# Patient Record
Sex: Female | Born: 1955 | Hispanic: Yes | Smoking: Never smoker
Health system: Southern US, Community
[De-identification: ages and names within clinical notes are randomized; demographics above are authoritative.]

## PROBLEM LIST (undated history)

## (undated) DIAGNOSIS — I10 Essential (primary) hypertension: Secondary | ICD-10-CM

## (undated) DIAGNOSIS — T7840XA Allergy, unspecified, initial encounter: Secondary | ICD-10-CM

## (undated) DIAGNOSIS — E785 Hyperlipidemia, unspecified: Secondary | ICD-10-CM

## (undated) DIAGNOSIS — Z789 Other specified health status: Secondary | ICD-10-CM

## (undated) HISTORY — DX: Essential (primary) hypertension: I10

## (undated) HISTORY — DX: Allergy, unspecified, initial encounter: T78.40XA

## (undated) HISTORY — DX: Hyperlipidemia, unspecified: E78.5

## (undated) HISTORY — DX: Other specified health status: Z78.9

---

## 1976-10-22 HISTORY — PX: TONSILLECTOMY: SUR1361

## 2012-04-21 DIAGNOSIS — Z0271 Encounter for disability determination: Secondary | ICD-10-CM

## 2012-08-16 ENCOUNTER — Ambulatory Visit (INDEPENDENT_AMBULATORY_CARE_PROVIDER_SITE_OTHER): Payer: BC Managed Care – PPO | Admitting: Emergency Medicine

## 2012-08-16 ENCOUNTER — Ambulatory Visit: Payer: BC Managed Care – PPO

## 2012-08-16 VITALS — BP 154/75 | HR 72 | Temp 98.2°F | Resp 16 | Ht 63.0 in | Wt 138.0 lb

## 2012-08-16 DIAGNOSIS — R0789 Other chest pain: Secondary | ICD-10-CM

## 2012-08-16 DIAGNOSIS — M542 Cervicalgia: Secondary | ICD-10-CM

## 2012-08-16 DIAGNOSIS — R071 Chest pain on breathing: Secondary | ICD-10-CM

## 2012-08-16 DIAGNOSIS — Z23 Encounter for immunization: Secondary | ICD-10-CM

## 2012-08-16 DIAGNOSIS — M549 Dorsalgia, unspecified: Secondary | ICD-10-CM

## 2012-08-16 MED ORDER — NAPROXEN SODIUM 550 MG PO TABS: 550.0000 mg | ORAL_TABLET | Freq: Two times a day (BID) | ORAL | Status: AC

## 2012-08-16 MED ORDER — HYDROCODONE-ACETAMINOPHEN 5-325 MG PO TABS: 1.0000 | ORAL_TABLET | ORAL | Status: AC | PRN

## 2012-08-16 MED ORDER — CYCLOBENZAPRINE HCL 10 MG PO TABS: 10.0000 mg | ORAL_TABLET | Freq: Three times a day (TID) | ORAL | Status: DC | PRN
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
## 2012-08-19 ENCOUNTER — Ambulatory Visit (INDEPENDENT_AMBULATORY_CARE_PROVIDER_SITE_OTHER): Payer: BC Managed Care – PPO | Admitting: Internal Medicine

## 2012-08-19 VITALS — BP 143/74 | HR 76 | Temp 98.0°F | Resp 18 | Ht 62.5 in | Wt 138.8 lb

## 2012-08-19 DIAGNOSIS — M79601 Pain in right arm: Secondary | ICD-10-CM

## 2012-08-19 DIAGNOSIS — S139XXA Sprain of joints and ligaments of unspecified parts of neck, initial encounter: Secondary | ICD-10-CM

## 2012-08-19 DIAGNOSIS — S336XXA Sprain of sacroiliac joint, initial encounter: Secondary | ICD-10-CM

## 2012-08-19 DIAGNOSIS — R209 Unspecified disturbances of skin sensation: Secondary | ICD-10-CM

## 2012-08-19 DIAGNOSIS — S335XXA Sprain of ligaments of lumbar spine, initial encounter: Secondary | ICD-10-CM

## 2012-08-19 MED ORDER — PREDNISONE 10 MG PO TABS: ORAL_TABLET | ORAL | Status: DC
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
## 2012-08-19 NOTE — Progress Notes (Signed)
  Subjective:    Patient ID: Amy [name], female    DOB: 1956-09-26, 56 y.o.   MRN: [id number]  HPI    Review of Systems     Objective:   Physical Exam        Assessment & Plan:

## 2012-08-19 NOTE — Patient Instructions (Addendum)
Take the Prednisone as instructed. Do not take Anaprox or ibuprofen with the prednisone. You may take the Flexeril and Norco(1/2 a pill if desired) at bedtime- or when not going to be driving.

## 2012-08-25 NOTE — Progress Notes (Signed)
Reviewed and agree.

## 2015-02-09 DIAGNOSIS — Z833 Family history of diabetes mellitus: Secondary | ICD-10-CM | POA: Insufficient documentation

## 2016-03-05 ENCOUNTER — Encounter: Payer: Self-pay | Admitting: Gastroenterology

## 2016-04-27 ENCOUNTER — Ambulatory Visit (AMBULATORY_SURGERY_CENTER): Payer: Self-pay | Admitting: *Deleted

## 2016-04-27 VITALS — Ht 63.0 in | Wt 142.0 lb

## 2016-04-27 DIAGNOSIS — Z1211 Encounter for screening for malignant neoplasm of colon: Secondary | ICD-10-CM

## 2016-04-27 MED ORDER — NA SULFATE-K SULFATE-MG SULF 17.5-3.13-1.6 GM/177ML PO SOLN: ORAL | Status: DC

## 2016-04-27 NOTE — Progress Notes (Signed)
No allergies to eggs or soy. No problems with anesthesia.  Pt given Emmi instructions for colonoscopy  No oxygen use  No diet drug use  

## 2016-04-30 ENCOUNTER — Encounter: Payer: Self-pay | Admitting: Gastroenterology

## 2016-05-11 ENCOUNTER — Ambulatory Visit (AMBULATORY_SURGERY_CENTER): Payer: BC Managed Care – PPO | Admitting: Gastroenterology

## 2016-05-11 ENCOUNTER — Encounter: Payer: Self-pay | Admitting: Gastroenterology

## 2016-05-11 VITALS — BP 100/60 | HR 52 | Temp 98.4°F | Resp 10 | Ht 63.0 in | Wt 142.0 lb

## 2016-05-11 DIAGNOSIS — Z1211 Encounter for screening for malignant neoplasm of colon: Secondary | ICD-10-CM | POA: Diagnosis present

## 2016-05-11 MED ORDER — SODIUM CHLORIDE 0.9 % IV SOLN: 500.0000 mL | INTRAVENOUS | Status: DC

## 2016-05-11 NOTE — Progress Notes (Signed)
Procedure:   Colonoscopy  Meds:   [facility]  Indication:  Average risk colon cancer screening  Quality of preparation:  Excellent  Findings:   Left-sided diverticulosis Internal hemorrhoids 11 minute withdrawal Impression:  See above  Recommendations:  Repeat colonoscopy in 10 years for screening   Amy [name] [facility] Pager [phone]

## 2016-05-11 NOTE — Progress Notes (Signed)
Called to room to assist during endoscopic procedure.  Patient ID and intended procedure confirmed with present staff. Received instructions for my participation in the procedure from the performing physician.  

## 2016-05-11 NOTE — Patient Instructions (Signed)
YOU HAD AN ENDOSCOPIC PROCEDURE TODAY AT THE [facility]:   Refer to the procedure report that was given to you for any specific questions about what was found during the examination.  If the procedure report does not answer your questions, please call your gastroenterologist to clarify.  If you requested that your care partner not be given the details of your procedure findings, then the procedure report has been included in a sealed envelope for you to review at your convenience later.  YOU SHOULD EXPECT: Some feelings of bloating in the abdomen. Passage of more gas than usual.  Walking can help get rid of the air that was put into your GI tract during the procedure and reduce the bloating. If you had a lower endoscopy (such as a colonoscopy or flexible sigmoidoscopy) you may notice spotting of blood in your stool or on the toilet paper. If you underwent a bowel prep for your procedure, you may not have a normal bowel movement for a few days.  Please Note:  You might notice some irritation and congestion in your nose or some drainage.  This is from the oxygen used during your procedure.  There is no need for concern and it should clear up in a day or so.  SYMPTOMS TO REPORT IMMEDIATELY:   Following lower endoscopy (colonoscopy or flexible sigmoidoscopy):  Excessive amounts of blood in the stool  Significant tenderness or worsening of abdominal pains  Swelling of the abdomen that is new, acute  Fever of 100F or higher   For urgent or emergent issues, a gastroenterologist can be reached at any hour by calling [phone].   DIET: Your first meal following the procedure should be a small meal and then it is ok to progress to your normal diet. Heavy or fried foods are harder to digest and may make you feel nauseous or bloated.  Likewise, meals heavy in dairy and vegetables can increase bloating.  Drink plenty of fluids but you should avoid alcoholic beverages for 24  hours.  ACTIVITY:  You should plan to take it easy for the rest of today and you should NOT DRIVE or use heavy machinery until tomorrow (because of the sedation medicines used during the test).    FOLLOW UP: Our staff will call the number listed on your records the next business day following your procedure to check on you and address any questions or concerns that you may have regarding the information given to you following your procedure. If we do not reach you, we will leave a message.  However, if you are feeling well and you are not experiencing any problems, there is no need to return our call.  We will assume that you have returned to your regular daily activities without incident.  If any biopsies were taken you will be contacted by phone or by letter within the next 1-3 weeks.  Please call us at [phone] if you have not heard about the biopsies in 3 weeks.    SIGNATURES/CONFIDENTIALITY: You and/or your care partner have signed paperwork which will be entered into your electronic medical record.  These signatures attest to the fact that that the information above on your After Visit Summary has been reviewed and is understood.  Full responsibility of the confidentiality of this discharge information lies with you and/or your care-partner. 

## 2016-05-11 NOTE — Progress Notes (Signed)
Report to PACU, RN, vss, BBS= Clear.  

## 2016-05-11 NOTE — Op Note (Signed)
Amy [name] Patient Name: Amy [name] Procedure Date: 05/11/2016 2:21 PM MRN: [id number] Endoscopist: Sherilyn Cooter L. Myrtie Neither , MD Age: 60 Referring MD:  Date of Birth: December 05, 1955 Gender: Female Account #: [id number] Procedure:                Colonoscopy Indications:              Screening for colorectal malignant neoplasm, This                            is the patient's first colonoscopy Medicines:                Monitored Anesthesia Care Procedure:                Pre-Anesthesia Assessment:                           - Prior to the procedure, a History and Physical                            was performed, and patient medications and                            allergies were reviewed. The patient's tolerance of                            previous anesthesia was also reviewed. The risks                            and benefits of the procedure and the sedation                            options and risks were discussed with the patient.                            All questions were answered, and informed consent                            was obtained. Prior Anticoagulants: The patient has                            taken no previous anticoagulant or antiplatelet                            agents. ASA Grade Assessment: II - A patient with                            mild systemic disease. After reviewing the risks                            and benefits, the patient was deemed in                            satisfactory condition to undergo the procedure.  After obtaining informed consent, the colonoscope                            was passed under direct vision. Throughout the                            procedure, the patient's blood pressure, pulse, and                            oxygen saturations were monitored continuously. The                            Model PCF-H190DL [id number]) scope was introduced                            through the anus and  advanced to the the cecum,                            identified by appendiceal orifice and ileocecal                            valve. The colonoscopy was performed without                            difficulty. The patient tolerated the procedure                            well. The quality of the bowel preparation was                            excellent. No anatomical landmarks were                            photographed due to technical problems. The                            withdrawal time from the cecum was 11 minutes. Scope In: Scope Out: Findings:                 The perianal and digital rectal examinations were                            normal.                           Diverticula were found in the left [name].                           Internal hemorrhoids were found during                            retroflexion. The hemorrhoids were Grade I                            (internal hemorrhoids that do not prolapse).  The exam was otherwise without abnormality. Complications:            No immediate complications. Estimated Blood Loss:     Estimated blood loss: none. Impression:               - Diverticulosis in the left [name].                           - Internal hemorrhoids.                           - The examination was otherwise normal.                           - No specimens collected. Recommendation:           - Patient has a contact number available for                            emergencies. The signs and symptoms of potential                            delayed complications were discussed with the                            patient. Return to normal activities tomorrow.                            Written discharge instructions were provided to the                            patient.                           - Resume previous diet.                           - Continue present medications.                           - Repeat colonoscopy in  10 years for screening                            purposes. Amy L. Myrtie Neitheranis, MD 05/11/2016 5:46:17 PM This report has been signed electronically.

## 2016-05-14 ENCOUNTER — Telehealth: Payer: Self-pay | Admitting: *Deleted

## 2016-05-14 NOTE — Telephone Encounter (Signed)
  Follow up Call-  Call back number 05/11/2016  Post procedure Call Back phone  # [phone]  Permission to leave phone message Yes  Some recent data might be hidden     Patient questions:  Do you have a fever, pain , or abdominal swelling? No. Pain Score  0 *  Have you tolerated food without any problems? Yes.    Have you been able to return to your normal activities? Yes.    Do you have any questions about your discharge instructions: Diet   No. Medications  No. Follow up visit  No.  Do you have questions or concerns about your Care? No.  Actions: * If pain score is 4 or above: No action needed, pain <4.

## 2018-02-12 ENCOUNTER — Encounter (HOSPITAL_COMMUNITY): Payer: Self-pay | Admitting: Emergency Medicine

## 2018-02-12 ENCOUNTER — Emergency Department (HOSPITAL_COMMUNITY): Payer: BC Managed Care – PPO

## 2018-02-12 ENCOUNTER — Emergency Department (HOSPITAL_COMMUNITY)
Admission: EM | Admit: 2018-02-12 | Discharge: 2018-02-13 | Disposition: A | Discharge status: Home / Self Care | Payer: BC Managed Care – PPO | Attending: Emergency Medicine | Admitting: Emergency Medicine

## 2018-02-12 ENCOUNTER — Other Ambulatory Visit: Payer: Self-pay

## 2018-02-12 DIAGNOSIS — K5700 Diverticulitis of small intestine with perforation and abscess without bleeding: Secondary | ICD-10-CM | POA: Insufficient documentation

## 2018-02-12 DIAGNOSIS — Z79899 Other long term (current) drug therapy: Secondary | ICD-10-CM | POA: Diagnosis not present

## 2018-02-12 DIAGNOSIS — R1030 Lower abdominal pain, unspecified: Secondary | ICD-10-CM | POA: Diagnosis present

## 2018-02-12 DIAGNOSIS — K5792 Diverticulitis of intestine, part unspecified, without perforation or abscess without bleeding: Secondary | ICD-10-CM

## 2018-02-12 LAB — COMPREHENSIVE METABOLIC PANEL
ALBUMIN: 4.1 g/dL (ref 3.5–5.0)
ALT: 26 U/L (ref 14–54)
AST: 29 U/L (ref 15–41)
Alkaline Phosphatase: 70 U/L (ref 38–126)
Anion gap: 12 (ref 5–15)
BUN: 13 mg/dL (ref 6–20)
CHLORIDE: 99 mmol/L — AB (ref 101–111)
CO2: 24 mmol/L (ref 22–32)
CREATININE: 0.72 mg/dL (ref 0.44–1.00)
Calcium: 9.8 mg/dL (ref 8.9–10.3)
GFR calc Af Amer: >60 mL/min (ref >60)
GLUCOSE: 108 mg/dL — AB (ref 65–99)
POTASSIUM: 3.7 mmol/L (ref 3.5–5.1)
Sodium: 135 mmol/L (ref 135–145)
Total Bilirubin: 0.7 mg/dL (ref 0.3–1.2)
Total Protein: 7.5 g/dL (ref 6.5–8.1)

## 2018-02-12 LAB — CBC
HCT: 39.9 % (ref 36.0–46.0)
Hemoglobin: 13.1 g/dL (ref 12.0–15.0)
MCH: 29.3 pg (ref 26.0–34.0)
MCHC: 32.8 g/dL (ref 30.0–36.0)
MCV: 89.3 fL (ref 78.0–100.0)
PLATELETS: 318 10*3/uL (ref 150–400)
RBC: 4.47 MIL/uL (ref 3.87–5.11)
RDW: 12.6 % (ref 11.5–15.5)
WBC: 7 10*3/uL (ref 4.0–10.5)

## 2018-02-12 LAB — URINALYSIS, ROUTINE W REFLEX MICROSCOPIC
BILIRUBIN URINE: NEGATIVE
GLUCOSE, UA: NEGATIVE mg/dL
Hgb urine dipstick: NEGATIVE
KETONES UR: NEGATIVE mg/dL
Leukocytes, UA: NEGATIVE
Nitrite: NEGATIVE
PROTEIN: NEGATIVE mg/dL
Specific Gravity, Urine: 1.016 (ref 1.005–1.030)
pH: 6 (ref 5.0–8.0)

## 2018-02-12 LAB — LIPASE, BLOOD: LIPASE: 32 U/L (ref 11–51)

## 2018-02-12 MED ORDER — IOPAMIDOL (ISOVUE-300) INJECTION 61%
INTRAVENOUS | Status: AC
  Filled 2018-02-12: qty 100

## 2018-02-12 MED ORDER — IOPAMIDOL (ISOVUE-300) INJECTION 61%
100.0000 mL | Freq: Once | INTRAVENOUS | Status: AC | PRN
  Administered 2018-02-12: 100 mL via INTRAVENOUS

## 2018-02-12 MED ORDER — FENTANYL CITRATE (PF) 100 MCG/2ML IJ SOLN
50.0000 ug | Freq: Once | INTRAMUSCULAR | Status: AC
  Administered 2018-02-12: 50 ug via INTRAVENOUS
  Filled 2018-02-12: qty 2

## 2018-02-12 MED ORDER — ONDANSETRON HCL 4 MG/2ML IJ SOLN
4.0000 mg | Freq: Once | INTRAMUSCULAR | Status: AC
Start: 2018-02-12 — End: 2018-02-12
  Administered 2018-02-12: 4 mg via INTRAVENOUS
  Filled 2018-02-12: qty 2

## 2018-02-12 NOTE — ED Triage Notes (Signed)
Patient presents to ED for assessment of 1 week of nausea and vomiting with diarrhea, x1 each.  Patient c/o diffuse abdominal pain.  EDP at bedside

## 2018-02-12 NOTE — ED Provider Notes (Signed)
Patient placed in Quick Look pathway, seen and evaluated   Chief Complaint: Abdominal pain  HPI:   Patient is a 62 year old female who presents with a one-week history of lower abdominal pain, nausea, vomiting, and diarrhea.  She has had associated intermittent fever.  She has been taking Tylenol at home without significant relief.  Her pain is sharp.  She reports seeing her primary care provider who gave her medicine for diarrhea, however this is not improving her abdominal pain.  It has improved her diarrhea mildly.    ROS: She reports abdominal pain, nausea, vomiting, and diarrhea.  She denies bloody stools.  She denies any chest pain or shortness of breath.  She has had some nasal congestion, but no cough. (one)  Physical Exam:   Gen: No distress  Neuro: Awake and Alert  Skin: Warm    Focused Exam: Heart normal rhythm, mild tachycardia, lungs clear to auscultation, left lower quadrant and suprapubic tenderness, mild right lower quadrant tenderness, no rigidity, however guarding present; patient actively dry heaving.  CBC, CMP, lipase, UA, CT abdomen pelvis ordered from triage as well as Zofran and pain medication. NPO.   Initiation of care has begun. The patient has been counseled on the process, plan, and necessity for staying for the completion/evaluation, and the remainder of the medical screening examination    Verdis PrimeLaw, Souleymane Saiki M, PA-C 02/12/18 1623    Shaune PollackIsaacs, Cameron, MD 02/13/18 [id number]

## 2018-02-13 MED ORDER — CIPROFLOXACIN IN D5W 400 MG/200ML IV SOLN
400.0000 mg | Freq: Once | INTRAVENOUS | Status: AC
  Administered 2018-02-13: 400 mg via INTRAVENOUS
  Filled 2018-02-13: qty 200

## 2018-02-13 MED ORDER — CIPROFLOXACIN HCL 500 MG PO TABS: 500.0000 mg | ORAL_TABLET | Freq: Two times a day (BID) | ORAL | 0 refills | Status: DC

## 2018-02-13 MED ORDER — ONDANSETRON HCL 4 MG/2ML IJ SOLN
4.0000 mg | Freq: Once | INTRAMUSCULAR | Status: AC
  Administered 2018-02-13: 4 mg via INTRAVENOUS
  Filled 2018-02-13: qty 2

## 2018-02-13 MED ORDER — ONDANSETRON 8 MG PO TBDP: ORAL_TABLET | ORAL | 0 refills | Status: DC

## 2018-02-13 MED ORDER — METRONIDAZOLE IN NACL 5-0.79 MG/ML-% IV SOLN
500.0000 mg | Freq: Once | INTRAVENOUS | Status: AC
  Administered 2018-02-13: 500 mg via INTRAVENOUS
  Filled 2018-02-13: qty 100

## 2018-02-13 MED ORDER — MORPHINE SULFATE (PF) 4 MG/ML IV SOLN
4.0000 mg | Freq: Once | INTRAVENOUS | Status: AC
  Administered 2018-02-13: 4 mg via INTRAVENOUS
  Filled 2018-02-13: qty 1

## 2018-02-13 MED ORDER — METRONIDAZOLE 500 MG PO TABS: 500.0000 mg | ORAL_TABLET | Freq: Two times a day (BID) | ORAL | 0 refills | Status: DC

## 2018-02-13 NOTE — ED Provider Notes (Signed)
[facility] EMERGENCY DEPARTMENT Provider Note   CSN: [id number] Arrival date & time: 02/12/18  1454     History   Chief Complaint Chief Complaint  Patient presents with  . Abdominal Pain    HPI Amy [name] is a 62 y.o. female with a hx of tonsillectomy presents to the Emergency Department complaining of gradual, persistent, progressively worsening lower abdominal pain onset approximately 1 week ago but worse in the last 2 days.  Patient reports she saw her primary care provider 3 days ago and was discharged home with azithromycin.  She reports she has taken this with improvement in her diarrhea but without improvement in her pain or vomiting.  She reports last bout of emesis was this morning.  She has since eaten chicken noodle soup.  Patient reports associated nausea, vomiting and diarrhea.  She reports emesis is nonbloody nonbilious.  Diarrhea is loose stools without melena or hematochezia.  Nothing seems to make her symptoms worse.  Patient denies headache, neck pain, neck stiffness, chest pain, shortness of breath, weakness, dizziness, syncope, dysuria, hematuria, vaginal discharge, vaginal bleeding.  The history is provided by the patient and medical records. No language interpreter was used.    Past Medical History:  Diagnosis Date  . Allergy     There are no active problems to display for this patient.   Past Surgical History:  Procedure Laterality Date  . TONSILLECTOMY  1978     OB History   None      Home Medications    Prior to Admission medications   Medication Sig Start Date End Date Taking? Authorizing Provider  cetirizine (ZYRTEC) 10 MG tablet Take 10 mg by mouth daily.   Yes [provider]  fluticasone (FLONASE) 50 MCG/ACT nasal spray Place 2 sprays into both nostrils daily as needed for allergies. Reported on 04/27/2016   Yes [provider]  Multiple Vitamin (MULTIVITAMIN) tablet Take 1 tablet by mouth daily.   Yes  [provider]  ranitidine (ZANTAC) 150 MG tablet Take 150 mg by mouth 2 (two) times daily as needed for heartburn.    Yes [provider]  ciprofloxacin (CIPRO) 500 MG tablet Take 1 tablet (500 mg total) by mouth 2 (two) times daily. One po bid x 7 days 02/13/18   Tyreece Gelles, Dahlia Client, PA-C  metroNIDAZOLE (FLAGYL) 500 MG tablet Take 1 tablet (500 mg total) by mouth 2 (two) times daily. One po bid x 7 days 02/13/18   Madine Sarr, Dahlia Client, PA-C  ondansetron [facility] ODT) 8 MG disintegrating tablet 8mg  ODT q4 hours prn nausea 02/13/18   Laconda Basich, Dahlia Client, PA-C    Family History Family History  Problem Relation Age of Onset  . Diabetes Mother   . Diabetes Sister   . Diabetes Brother   . [name] cancer Neg Hx     Social History Social History   Tobacco Use  . Smoking status: Never Smoker  . Smokeless tobacco: Never Used  Substance Use Topics  . Alcohol use: No    Alcohol/week: 0.0 oz  . Drug use: No     Allergies   Patient has no known allergies.   Review of Systems Review of Systems  Constitutional: Negative for appetite change, diaphoresis, fatigue, fever and unexpected weight change.  HENT: Negative for mouth sores.   Eyes: Negative for visual disturbance.  Respiratory: Negative for cough, chest tightness, shortness of breath and wheezing.   Cardiovascular: Negative for chest pain.  Gastrointestinal: Positive for abdominal pain, diarrhea, nausea  and vomiting. Negative for constipation.  Endocrine: Negative for polydipsia, polyphagia and polyuria.  Genitourinary: Negative for dysuria, frequency, hematuria and urgency.  Musculoskeletal: Negative for back pain and neck stiffness.  Skin: Negative for rash.  Allergic/Immunologic: Negative for immunocompromised state.  Neurological: Negative for syncope, light-headedness and headaches.  Hematological: Does not bruise/bleed easily.  Psychiatric/Behavioral: Negative for sleep disturbance. The patient is not  nervous/anxious.      Physical Exam Updated Vital Signs BP 100/65 (BP Location: Left Arm)   Pulse 71   Temp 98.4 F (36.9 C) (Oral)   Resp 16   SpO2 100%   Physical Exam  Constitutional: She appears well-developed and well-nourished. No distress.  Awake, alert, nontoxic appearance  HENT:  Head: Normocephalic and atraumatic.  Mouth/Throat: Oropharynx is clear and moist. No oropharyngeal exudate.  Eyes: Conjunctivae are normal. No scleral icterus.  Neck: Normal range of motion. Neck supple.  Cardiovascular: Normal rate, regular rhythm and intact distal pulses.  Pulmonary/Chest: Effort normal and breath sounds normal. No respiratory distress. She has no wheezes.  Equal chest expansion  Abdominal: Soft. Bowel sounds are normal. She exhibits no mass. There is tenderness in the left lower quadrant. There is no rigidity, no rebound, no guarding and no CVA tenderness.  Musculoskeletal: Normal range of motion. She exhibits no edema.  Neurological: She is alert.  Speech is clear and goal oriented Moves extremities without ataxia  Skin: Skin is warm and dry. She is not diaphoretic.  Psychiatric: She has a normal mood and affect.  Nursing note and vitals reviewed.    ED Treatments / Results  Labs (all labs ordered are listed, but only abnormal results are displayed) Labs Reviewed  COMPREHENSIVE METABOLIC PANEL - Abnormal; Notable for the following components:      Result Value   Chloride 99 (*)    Glucose, Bld 108 (*)    All other components within normal limits  LIPASE, BLOOD  CBC  URINALYSIS, ROUTINE W REFLEX MICROSCOPIC     Radiology Ct Abdomen Pelvis W Contrast  Result Date: 02/12/2018 CLINICAL DATA:  Initial evaluation for acute abdominal pain with nausea, vomiting, diarrhea for 1 week. EXAM: CT ABDOMEN AND PELVIS WITH CONTRAST TECHNIQUE: Multidetector CT imaging of the abdomen and pelvis was performed using the standard protocol following bolus administration of  intravenous contrast. CONTRAST:  ISOVUE-300 IOPAMIDOL (ISOVUE-300) INJECTION 61% COMPARISON:  None. FINDINGS: Lower chest: Mild scattered bibasilar atelectatic changes. Visualized lung bases are otherwise clear. Hepatobiliary: 9 mm hypodensity within the left hepatic lobe noted, too small the characterize, but could reflect a small cyst or possibly hemangioma. This is of doubtful significance. Liver otherwise unremarkable. Gallbladder within normal limits. No biliary dilatation. Pancreas: Pancreas within normal limits. Spleen: 1 cm cyst present within the medial aspect of the spleen. Spleen otherwise unremarkable. Adrenals/Urinary Tract: Adrenal glands are normal. Kidneys equal in size with symmetric enhancement. No nephrolithiasis, hydronephrosis, or focal enhancing renal mass. No appreciable hydroureter. Bladder largely decompressed without acute abnormality. Stomach/Bowel: Stomach within normal limits. No evidence for bowel obstruction. Appendix within normal limits. Moderate to large volume stool within the ascending and transverse [name] colonic diverticulosis. Circumferential wall thickening with mild hazy stranding seen about a short segment of sigmoid [name] within the left lower quadrant, suggesting possible acute sigmoid diverticulitis (series 3, image 70). No evidence for perforation or other complication. No other acute inflammatory changes seen about the bowels. Vascular/Lymphatic: Normal intravascular enhancement seen throughout the intra-abdominal aorta. No aneurysm. Mesenteric vessels patent proximally. No adenopathy.  Reproductive: 3.2 cm round lesion within the uterus most likely reflects a uterine fibroid. Left ovary closely approximates the inflammatory changes within the adjacent sigmoid [name]. Right ovary within normal limits. Other: No free air or fluid. Small fat containing paraumbilical hernia noted. Musculoskeletal: No acute osseus abnormality. No worrisome lytic or blastic osseous  lesions. Degenerative spondylolysis noted at L4-5 and L5-S1. IMPRESSION: 1. Focal wall thickening with mild inflammatory stranding about a few sigmoid diverticula in the left lower quadrant, suggestive of acute sigmoid diverticulitis. Clinical follow-up to resolution and correlation with colonoscopy recommended as an underlying mass lesion could have a similar appearance. 2. Moderate to large volume retained stool within the proximal and mid [name], suggesting constipation. 3. Fibroid uterus. Electronically Signed   By: Rise MuBenjamin  McClintock M.D.   On: 02/12/2018 19:28       Procedures Procedures (including critical care time)  Medications Ordered in ED Medications  ondansetron (ZOFRAN) injection 4 mg (4 mg Intravenous Given 02/12/18 1650)  fentaNYL (SUBLIMAZE) injection 50 mcg (50 mcg Intravenous Given 02/12/18 1652)  iopamidol (ISOVUE-300) 61 % injection 100 mL (100 mLs Intravenous Contrast Given 02/12/18 1845)  metroNIDAZOLE (FLAGYL) IVPB 500 mg (0 mg Intravenous Stopped 02/13/18 0224)  ciprofloxacin (CIPRO) IVPB 400 mg (0 mg Intravenous Stopped 02/13/18 0224)  morphine 4 MG/ML injection 4 mg (4 mg Intravenous Given 02/13/18 0114)  ondansetron (ZOFRAN) injection 4 mg (4 mg Intravenous Given 02/13/18 0114)     Initial Impression / Assessment and Plan / ED Course  I have reviewed the triage vital signs and the nursing notes.  Pertinent labs & imaging results that were available during my care of the patient were reviewed by me and considered in my medical decision making (see chart for details).     Patient with lower abdominal and left lower abdominal pain for several days with nausea vomiting and diarrhea.  Labs are reassuring.  No leukocytosis.  No evidence of urinary tract infection on urinalysis.  Normal lipase and AST/ALT.  No evidence of pancreatitis or hepatitis.  Patient does not currently menstruate, doubt ectopic pregnancy.  CT scan shows evidence of diverticulitis.  No evidence of  abscess or bowel perforation.  I personally evaluated these images.  Patient abdomen without rebound or guarding.  No additional emesis here in the emergency department.  Patient given IV antibiotics and she has tolerated p.o. without difficulty.  Patient without hypotension.  She was initially tachycardic upon arrival however this resolved with fluids.  She has been afebrile throughout her time.  Will be discharged home with oral antibiotics.  Patient will need primary care follow-up this week to ensure resolution.  She is to return immediately to the emergency department for worsening symptoms, fevers, persistent vomiting.  Patient and daughter-in-law state understanding and are in agreement with this plan.  Final Clinical Impressions(s) / ED Diagnoses   Final diagnoses:  Diverticulitis    ED Discharge Orders        Ordered    metroNIDAZOLE (FLAGYL) 500 MG tablet  2 times daily     02/13/18 0232    ciprofloxacin (CIPRO) 500 MG tablet  2 times daily     02/13/18 0232    ondansetron (ZOFRAN ODT) 8 MG disintegrating tablet     02/13/18 0232       Orlandis Sanden, Dahlia ClientHannah, PA-C 02/13/18 0251    Derwood KaplanNanavati, Ankit, MD 02/13/18 [phone]

## 2018-02-13 NOTE — ED Notes (Signed)
PT states understanding of care given, follow up care, and medication prescribed. PT ambulated from ED to car with a steady gait. 

## 2018-02-13 NOTE — Discharge Instructions (Addendum)
1. Medications: zofran, Cipro, Flagyl, usual home medications 2. Treatment: rest, drink plenty of fluids, advance diet slowly 3. Follow Up: Please followup with your primary doctor in 2 days for discussion of your diagnoses and further evaluation after today's visit; if you do not have a primary care doctor use the resource guide provided to find one; Please return to the ER for persistent vomiting, high fevers, worsening pain or worsening symptoms

## 2018-03-20 ENCOUNTER — Ambulatory Visit: Payer: BC Managed Care – PPO | Admitting: Gastroenterology

## 2018-03-20 ENCOUNTER — Encounter: Payer: Self-pay | Admitting: Gastroenterology

## 2018-03-20 VITALS — BP 118/64 | HR 88 | Ht 62.0 in | Wt 141.8 lb

## 2018-03-20 DIAGNOSIS — K5732 Diverticulitis of large intestine without perforation or abscess without bleeding: Secondary | ICD-10-CM | POA: Diagnosis not present

## 2018-03-20 DIAGNOSIS — K5909 Other constipation: Secondary | ICD-10-CM

## 2018-03-20 NOTE — Patient Instructions (Addendum)
Continue metamucil daily.  One half capful miralax powder in a glass of liquid every other day.  It was a pleasure to meet you today!  Dr. Myrtie Neither

## 2018-03-20 NOTE — Progress Notes (Signed)
[facility] Progress Note  Chief Complaint: Diverticulitis  Subjective  History:  This is a very pleasant 62 year old woman who last saw me for screening colonoscopy in July 2017, at which time diverticulosis and no colon polyps were found.  She came to the ED a month ago with about a week of generalized abdominal pain that was slowly getting worse with associated intermittent vomiting.  She was having a period of constipation leading up to that illness, which had occurred occasionally in the past as well.  When she has constipation she might get some blood with wiping after bowel movements.  She was treated with 7 days of ciprofloxacin and metronidazole and has mostly recovered.  She is still having some occasional constipation that she is treated with decreasing carbohydrates, increasing vegetables and starting Metamucil and increasing water intake.  She has a bowel movement at least once a day, but does not always feel completely evacuated. The abdominal pain has resolved she has had no recent bleeding.  ROS: Cardiovascular:  no chest pain Respiratory: no dyspnea  The patient's Past Medical, Family and Social History were reviewed and are on file in the EMR.  Objective:  Med list reviewed  Current Outpatient Medications:  .  cetirizine (ZYRTEC) 10 MG tablet, Take 10 mg by mouth daily., Disp: , Rfl:  .  fluticasone (FLONASE) 50 MCG/ACT nasal spray, Place 2 sprays into both nostrils daily as needed for allergies. Reported on 04/27/2016, Disp: , Rfl:  .  Multiple Vitamin (MULTIVITAMIN) tablet, Take 1 tablet by mouth daily., Disp: , Rfl:  .  ranitidine (ZANTAC) 150 MG tablet, Take 150 mg by mouth 2 (two) times daily as needed for heartburn. , Disp: , Rfl:    Vital signs in last 24 hrs: Vitals:   03/20/18 1554  BP: 118/64  Pulse: 88    Physical Exam  Well-appearing  HEENT: sclera anicteric, oral mucosa moist without lesions  Neck: supple, no thyromegaly, JVD or  lymphadenopathy  Cardiac: RRR without murmurs, S1S2 heard, no peripheral edema  Pulm: clear to auscultation bilaterally, normal RR and effort noted  Abdomen: soft, no tenderness, with active bowel sounds. No guarding or palpable hepatosplenomegaly.  Skin; warm and dry, no jaundice or rash  Recent Labs:  CMP Latest Ref Rng & Units 02/12/2018  Glucose 65 - 99 mg/dL 098(J)  BUN 6 - 20 mg/dL 13  Creatinine 1.91 - 4.78 mg/dL 2.95  Sodium 621 - 308 mmol/L 135  Potassium 3.5 - 5.1 mmol/L 3.7  Chloride 101 - 111 mmol/L 99(L)  CO2 22 - 32 mmol/L 24  Calcium 8.9 - 10.3 mg/dL 9.8  Total Protein 6.5 - 8.1 g/dL 7.5  Total Bilirubin 0.3 - 1.2 mg/dL 0.7  Alkaline Phos 38 - 126 U/L 70  AST 15 - 41 U/L 29  ALT 14 - 54 U/L 26   CBC Latest Ref Rng & Units 02/12/2018  WBC 4.0 - 10.5 K/uL 7.0  Hemoglobin 12.0 - 15.0 g/dL 65.7  Hematocrit 84.6 - 46.0 % 39.9  Platelets 150 - 400 K/uL 318     Radiologic studies:  CTAP 02/12/18 (personally reviewed:  Report of sigmoid diverticulitis, significant stool and uterine fibroids  @ Assessment: Encounter Diagnoses  Name Primary?  . Diverticulitis of colon Yes  . Chronic constipation    Acute uncomplicated diverticulitis, first episode.  It was possibly precipitated by a period of constipation.  I think ischemic colitis is less likely.   Plan: Continue current management with high-fiber  diet, daily Metamucil, but I have also asked her to add one half capful of MiraLAX powder every other day to maintain regularity. I do not feel she needs any additional testing at this time, and she will see me as needed.   Total time 25 minutes, over half spent face-to-face with patient in counseling and coordination of care.   Charlie Pitter III

## 2019-07-24 ENCOUNTER — Ambulatory Visit
Admission: RE | Admit: 2019-07-24 | Discharge: 2019-07-24 | Disposition: A | Discharge status: Home / Self Care | Payer: BC Managed Care – PPO | Source: Ambulatory Visit | Attending: Family Medicine | Admitting: Family Medicine

## 2019-07-24 ENCOUNTER — Other Ambulatory Visit: Payer: Self-pay | Admitting: Family Medicine

## 2019-07-24 DIAGNOSIS — R52 Pain, unspecified: Secondary | ICD-10-CM

## 2020-09-02 ENCOUNTER — Ambulatory Visit: Payer: BC Managed Care – PPO | Admitting: Allergy

## 2020-09-02 ENCOUNTER — Other Ambulatory Visit: Payer: Self-pay

## 2020-09-02 ENCOUNTER — Encounter: Payer: Self-pay | Admitting: Allergy

## 2020-09-02 VITALS — BP 116/64 | HR 86 | Temp 98.2°F | Resp 16 | Ht 63.0 in | Wt 146.4 lb

## 2020-09-02 DIAGNOSIS — J3089 Other allergic rhinitis: Secondary | ICD-10-CM

## 2020-09-02 DIAGNOSIS — H1013 Acute atopic conjunctivitis, bilateral: Secondary | ICD-10-CM | POA: Diagnosis not present

## 2020-09-02 MED ORDER — LEVOCETIRIZINE DIHYDROCHLORIDE 5 MG PO TABS: 5.0000 mg | ORAL_TABLET | Freq: Every evening | ORAL | 5 refills | Status: DC

## 2020-09-02 MED ORDER — OLOPATADINE HCL 0.2 % OP SOLN: 1.0000 [drp] | Freq: Every day | OPHTHALMIC | 5 refills | Status: DC | PRN

## 2020-09-02 NOTE — Patient Instructions (Addendum)
Allergic rhinitis with conjunctivitis  - environmental allergy skin testing is positive to tree pollen, molds, dog  - allergen avoidance measures discussed/handouts provided  - stop Zyrtec as not effective  - start either Xyzal 5mg  or Allegra 180mg  daily.  These are long-acting antihistamines similar to Zyrtec that may be more effective for you  - for watery, red, itchy eyes use over-the-counter Pataday or Pataday Xtra Strength 1 drop each eye daily as needed  - if allergy medications are not effective in managing your eye symptoms then will consider performing patch testing for possible contact allergy  Follow-up in 4-6 months or sooner if needed

## 2020-09-02 NOTE — Progress Notes (Addendum)
New Patient Note  RE: Amy [name] MRN: [id number] DOB: 05/25/1956 Date of Office Visit: 09/02/2020  Referring provider: No ref. provider found Primary care provider: Lewis Moccasin, MD  Chief Complaint: eye issues  History of present illness: Amy [name] is a 64 y.o. female presenting today for consultation for ocular allergy.   She states she has eye swelling, redness, watering and hurts and states can occur with both eyes.  Symptoms can be year-round.  Can occur in any environment.    She did try her grandson's allergy eyedrop before and it was helpful in minimizing her symptoms.  This eyedrop was Pataday. She then bought Opcon over-the-counter eye drop and states it "refreshes" the eye but was not as effective as the Pataday was.   She states she stopped wearing make-up as she was not sure if her makeup products were causing her ocular symptoms.  She however denies having any rash of the skin around the eye.  She takes zyrtec daily in AM for years.  She also reports runny nose, sneezing, facial itch.  She notices these symptoms more in winter time but also notes during pollen season.    No history of asthma, eczema or food allergy.   Review of systems: Review of Systems  Constitutional: Negative.   HENT: Negative.   Eyes: Positive for discharge (Watering) and redness.  Respiratory: Negative.   Cardiovascular: Negative.   Gastrointestinal: Negative.   Musculoskeletal: Negative.   Skin: Negative.   Neurological: Negative.     All other systems negative unless noted above in HPI  Past medical history: Past Medical History:  Diagnosis Date  . Allergy     Past surgical history: Past Surgical History:  Procedure Laterality Date  . TONSILLECTOMY  1978    Family history:  Family History  Problem Relation Age of Onset  . Diabetes Mother   . Diabetes Sister   . Diabetes Brother   . Colon cancer Neg Hx     Social history: She lives in a  townhome with electric heating and central cooling.  No pets in the home.  There is no concern for water damage, mildew or worse in the home.  She works in a [employer].  Denies a smoking history.  Medication List: Current Outpatient Medications  Medication Sig Dispense Refill  . cetirizine (ZYRTEC) 10 MG tablet Take 10 mg by mouth daily.    . Multiple Vitamin (MULTIVITAMIN) tablet Take 1 tablet by mouth daily.     No current facility-administered medications for this visit.    Known medication allergies: No Known Allergies   Physical examination: Blood pressure 116/64, pulse 86, temperature 98.2 F (36.8 C), temperature source Temporal, resp. rate 16, height 5\' 3"  (1.6 m), weight 146 lb 6.4 oz (66.4 kg), SpO2 96 %.  General: Alert, interactive, in no acute distress. HEENT: PERRLA, TMs pearly gray, turbinates non-edematous without discharge, post-pharynx non erythematous. Neck: Supple without lymphadenopathy. Lungs: Clear to auscultation without wheezing, rhonchi or rales. {no increased work of breathing. CV: Normal S1, S2 without murmurs. Abdomen: Nondistended, nontender. Skin: Warm and dry, without lesions or rashes. Extremities:  No clubbing, cyanosis or edema. Neuro:   Grossly intact.  Diagnositics/Labs: Allergy testing: Environmental allergy skin prick testing is positive to birch, hickory, Rhizopus, dog.  Intradermal testing is positive to mold mix 2. Allergy testing results were read and interpreted by provider, documented by clinical staff.   Assessment and plan:   Allergic rhinitis with conjunctivitis  - environmental  allergy skin testing is positive to tree pollen, molds, dog  - allergen avoidance measures discussed/handouts provided  - stop Zyrtec as not effective  - start either Xyzal 5mg  or Allegra 180mg  daily.  These are long-acting antihistamines similar to Zyrtec that may be more effective for you  - for watery, red, itchy eyes use over-the-counter  Pataday or Pataday Xtra Strength 1 drop each eye daily as needed  - if allergy medications are not effective in managing your eye symptoms then will consider performing patch testing for possible contact allergy  Follow-up in 4-6 months or sooner if needed  I appreciate the opportunity to take part in Tanyia's care. Please do not hesitate to contact me with questions.  Sincerely,   , MD Allergy/Immunology [facility]

## 2020-10-05 ENCOUNTER — Ambulatory Visit: Payer: Self-pay | Admitting: Allergy

## 2021-01-05 ENCOUNTER — Encounter: Payer: Self-pay | Admitting: Allergy

## 2021-01-05 ENCOUNTER — Ambulatory Visit: Payer: BC Managed Care – PPO | Admitting: Allergy

## 2021-01-05 ENCOUNTER — Other Ambulatory Visit: Payer: Self-pay

## 2021-01-05 VITALS — BP 122/68 | HR 75 | Temp 98.0°F | Resp 17 | Ht 63.0 in | Wt 146.2 lb

## 2021-01-05 DIAGNOSIS — H1013 Acute atopic conjunctivitis, bilateral: Secondary | ICD-10-CM

## 2021-01-05 DIAGNOSIS — J3089 Other allergic rhinitis: Secondary | ICD-10-CM | POA: Diagnosis not present

## 2021-01-05 DIAGNOSIS — B351 Tinea unguium: Secondary | ICD-10-CM | POA: Diagnosis not present

## 2021-01-05 MED ORDER — OLOPATADINE HCL 0.2 % OP SOLN: 1.0000 [drp] | Freq: Every day | OPHTHALMIC | 5 refills | Status: AC | PRN

## 2021-01-05 MED ORDER — FEXOFENADINE HCL 180 MG PO TABS: 180.0000 mg | ORAL_TABLET | Freq: Every day | ORAL | 5 refills | Status: AC

## 2021-01-05 NOTE — Patient Instructions (Addendum)
Allergic rhinitis with conjunctivitis  - continue avoidance measures for tree pollen, molds, dog  - Xyzal causes drowsiness thus will stop.   Will try Allegra 180mg  daily to replace Xyzal.  Let know if you tolerate this antihistamine and if it causes drowsiness or not  - for watery, red, itchy eyes use Pataday or Pataday Xtra Strength 1 drop each eye daily as needed  - if allergy medications are not effective in managing your eye symptoms then will consider performing patch testing for possible contact allergy  Will place podiatry referral for you to evaluate for toenail fungus  Follow-up in 6 months or sooner if needed

## 2021-01-05 NOTE — Progress Notes (Signed)
Follow-up Note  RE: Amy [name] MRN: [id number] DOB: [birth year] Date of Office Visit: 01/05/2021   History of present illness: Amy [name] is a 65 y.o. female presenting today for follow-up of allergic rhinitis with conjunctivitis.  She was last seen in the office on 09/02/2020 by myself.  She states she has been doing okay since her last visit without any major health changes, surgeries or hospitalizations.  She does states that the Xyzal makes her sleepy and she is still drowsy into the next morning.  She does feel like it helps more so than the Zyrtec did.  But due to the drowsiness she would like to try something different.  She states that the Pataday is still very effective in relieving her itchy and watery eyes. She does have asked today about issues with her foot.  She states when she was at her pedicurist she was concerned she may have a toe fungus.  She is not sure what she can do about this or who she should see.     Review of systems: Review of Systems  Constitutional: Negative.   HENT: Negative.   Eyes: Negative.   Respiratory: Negative.   Cardiovascular: Negative.   Gastrointestinal: Negative.   Musculoskeletal: Negative.   Skin: Negative.   Neurological: Negative.     All other systems negative unless noted above in HPI  Past medical/social/surgical/family history have been reviewed and are unchanged unless specifically indicated below.  No changes  Medication List: Current Outpatient Medications  Medication Sig Dispense Refill  . fexofenadine (ALLEGRA) 180 MG tablet Take 1 tablet (180 mg total) by mouth daily. 1 tablet 5  . levocetirizine (XYZAL) 5 MG tablet Take 1 tablet (5 mg total) by mouth every evening. 30 tablet 5  . Multiple Vitamin (MULTIVITAMIN) tablet Take 1 tablet by mouth daily.    . Olopatadine HCl 0.2 % SOLN Apply 1 drop to eye daily as needed (watery, red, itchy eyes). 2.5 mL 5   No current facility-administered medications for  this visit.     Known medication allergies: No Known Allergies   Physical examination: Blood pressure 122/68, pulse 75, temperature 98 F (36.7 C), temperature source Temporal, resp. rate 17, height 5\' 3"  (1.6 m), weight 146 lb 3.2 oz (66.3 kg), SpO2 95 %.  General: Alert, interactive, in no acute distress. HEENT: PERRLA, TMs pearly gray, turbinates non-edematous without discharge, post-pharynx non erythematous. Neck: Supple without lymphadenopathy. Lungs: Clear to auscultation without wheezing, rhonchi or rales. {no increased work of breathing. CV: Normal S1, S2 without murmurs. Abdomen: Nondistended, nontender. Skin: Warm and dry, without lesions or rashes. Extremities: Toenails are painted with nail polish.  No clubbing, cyanosis or edema. Neuro:   Grossly intact.  Diagnositics/Labs: None today  Assessment and plan:   Allergic rhinitis with conjunctivitis  - continue avoidance measures for tree pollen, molds, dog  - Xyzal causes drowsiness thus will stop.   Will try Allegra 180mg  daily to replace Xyzal.  Let know if you tolerate this antihistamine and if it causes drowsiness or not  - for watery, red, itchy eyes use Pataday or Pataday Xtra Strength 1 drop each eye daily as needed  - if allergy medications are not effective in managing your eye symptoms then will consider performing patch testing for possible contact allergy  ?  Toenail fungus -Toenails are nicely painted today thus cannot visualize if there is potentially a toenail fungus  - Will place podiatry referral for you to evaluate for toenail fungus  Follow-up in 6 months or sooner if needed  I appreciate the opportunity to take part in Cire's care. Please do not hesitate to contact me with questions.  Sincerely,   Margo Aye, MD Allergy/Immunology [facility]

## 2021-01-06 NOTE — Progress Notes (Signed)
Referral has been placed internally with [facility] in [place]. Patient has been notified with all information.

## 2021-01-12 NOTE — Progress Notes (Signed)
Following back up to make sure patient was scheduled:  Patient looks like she was scheduled for 01/17/21 to see a provider at the [facility].

## 2021-01-17 ENCOUNTER — Other Ambulatory Visit: Payer: Self-pay | Admitting: Podiatry

## 2021-01-17 ENCOUNTER — Other Ambulatory Visit: Payer: Self-pay

## 2021-01-17 ENCOUNTER — Ambulatory Visit: Payer: BC Managed Care – PPO | Admitting: Podiatry

## 2021-01-17 DIAGNOSIS — B351 Tinea unguium: Secondary | ICD-10-CM

## 2021-01-17 DIAGNOSIS — B353 Tinea pedis: Secondary | ICD-10-CM | POA: Diagnosis not present

## 2021-01-17 MED ORDER — KETOCONAZOLE 2 % EX CREA: 1.0000 "application " | TOPICAL_CREAM | Freq: Every day | CUTANEOUS | 2 refills | Status: DC

## 2021-01-17 MED ORDER — TERBINAFINE HCL 250 MG PO TABS: 250.0000 mg | ORAL_TABLET | Freq: Every day | ORAL | 0 refills | Status: AC

## 2021-01-17 MED ORDER — JUBLIA 10 % EX SOLN: 1.0000 "application " | Freq: Every day | CUTANEOUS | 11 refills | Status: DC

## 2021-01-17 NOTE — Patient Instructions (Addendum)
If you need a coupon for the copay card for Jublia go to  https://www.orthorxaccess.com/ Fungal Nail Infection A fungal nail infection is a common infection of the toenails or fingernails. This condition affects toenails more often than fingernails. It often affects the great, or big, toes. More than one nail may be infected. The condition can be passed from person to person (is contagious). What are the causes? This condition is caused by a fungus. Several types of fungi can cause the infection. These fungi are common in moist and warm areas. If your hands or feet come into contact with the fungus, it may get into a crack in your fingernail or toenail and cause the infection. What increases the risk? The following factors may make you more likely to develop this condition:  Being female.  Being of older age.  Living with someone who has the fungus.  Walking barefoot in areas where the fungus thrives, such as showers or locker rooms.  Wearing shoes and socks that cause your feet to sweat.  Having a nail injury or a recent nail surgery.  Having certain medical conditions, such as: ? Athlete's foot. ? Diabetes. ? Psoriasis. ? Poor circulation. ? A weak body defense system (immune system). What are the signs or symptoms? Symptoms of this condition include:  A pale spot on the nail.  Thickening of the nail.  A nail that becomes yellow or brown.  A brittle or ragged nail edge.  A crumbling nail.  A nail that has lifted away from the nail bed.   How is this diagnosed? This condition is diagnosed with a physical exam. Your health care provider may take a scraping or clipping from your nail to test for the fungus. How is this treated? Treatment is not needed for mild infections. If you have significant nail changes, treatment may include:  Antifungal medicines taken by mouth (orally). You may need to take the medicine for several weeks or several months, and you may not see the  results for a long time. These medicines can cause side effects. Ask your health care provider what problems to watch for.  Antifungal nail polish or nail cream. These may be used along with oral antifungal medicines.  Laser treatment of the nail.  Surgery to remove the nail. This may be needed for the most severe infections. It can take a long time, usually up to a year, for the infection to go away. The infection may also come back.   Follow these instructions at home: Medicines  Take or apply over-the-counter and prescription medicines only as told by your health care provider.  Ask your health care provider about using over-the-counter mentholated ointment on your nails. Nail care  Trim your nails often.  Wash and dry your hands and feet every day.  Keep your feet dry: ? Wear absorbent socks, and change your socks frequently. ? Wear shoes that allow air to circulate, such as sandals or canvas tennis shoes. Throw out old shoes.  Do not use artificial nails.  If you go to a nail salon, make sure you choose one that uses clean instruments.  Use antifungal foot powder on your feet and in your shoes. General instructions  Do not share personal items, such as towels or nail clippers.  Do not walk barefoot in shower rooms or locker rooms.  Wear rubber gloves if you are working with your hands in wet areas.  Keep all follow-up visits as told by your health care provider. This  is important. Contact a health care provider if: Your infection is not getting better or it is getting worse after several months. Summary  A fungal nail infection is a common infection of the toenails or fingernails.  Treatment is not needed for mild infections. If you have significant nail changes, treatment may include taking medicine orally and applying medicine to your nails.  It can take a long time, usually up to a year, for the infection to go away. The infection may also come back.  Take or  apply over-the-counter and prescription medicines only as told by your health care provider.  Follow instructions for taking care of your nails to help prevent infection from coming back or spreading. This information is not intended to replace advice given to you by your health care provider. Make sure you discuss any questions you have with your health care provider. Document Revised: 01/29/2019 Document Reviewed: 03/14/2018 Elsevier Patient Education  2021 Elsevier Inc.  Athlete's Foot Athlete's foot (tinea pedis) is a fungal infection of the skin on your feet. It often occurs on the skin that is between or underneath the toes. It can also occur on the soles of your feet. The infection can spread from person to person (is contagious). It can also spread when a person's bare feet come in contact with the fungus on shower floors or on items such as shoes. What are the causes? This condition is caused by a fungus that grows in warm, moist places. You can get athlete's foot by sharing shoes, shower stalls, towels, and wet floors with someone who is infected. Not washing your feet or changing your socks often enough can also lead to athlete's foot. What increases the risk? This condition is more likely to develop in:  Men.  People who have a weak body defense system (immune system).  People who have diabetes.  People who use public showers, such as at a gym.  People who wear heavy-duty shoes, such as Youth worker.  Seasons with warm, humid weather. What are the signs or symptoms? Symptoms of this condition include:  Itchy areas between your toes or on the soles of your feet.  White, flaky, or scaly areas between your toes or on the soles of your feet.  Very itchy small blisters between your toes or on the soles of your feet.  Small cuts in your skin. These cuts can become infected.  Thick or discolored toenails.   How is this diagnosed? This condition may be  diagnosed with a physical exam and a review of your medical history. Your health care provider may also take a skin or toenail sample to examine under a microscope. How is this treated? This condition is treated with antifungal medicines. These may be applied as powders, ointments, or creams. In severe cases, an oral antifungal medicine may be given. Follow these instructions at home: Medicines  Apply or take over-the-counter and prescription medicines only as told by your health care provider.  Apply your antifungal medicine as told by your health care provider. Do not stop using the antifungal even if your condition improves. Foot care  Do not scratch your feet.  Keep your feet dry: ? Wear cotton or wool socks. Change your socks every day or if they become wet. ? Wear shoes that allow air to flow, such as sandals or canvas tennis shoes.  Wash and dry your feet, including the area between your toes. Also, wash and dry your feet: ? Every day  or as told by your health care provider. ? After exercising. General instructions  Do not let others use towels, shoes, nail clippers, or other personal items that touch your feet.  Protect your feet by wearing sandals in wet areas, such as locker rooms and shared showers.  Keep all follow-up visits as told by your health care provider. This is important.  If you have diabetes, keep your blood sugar under control. Contact a health care provider if:  You have a fever.  You have swelling, soreness, warmth, or redness in your foot.  Your feet are not getting better with treatment.  Your symptoms get worse.  You have new symptoms. Summary  Athlete's foot (tinea pedis) is a fungal infection of the skin on your feet. It often occurs on skin that is between or underneath the toes.  This condition is caused by a fungus that grows in warm, moist places.  Symptoms include white, flaky, or scaly areas between your toes or on the soles of your  feet.  This condition is treated with antifungal medicines.  Keep your feet clean. Always dry them thoroughly. This information is not intended to replace advice given to you by your health care provider. Make sure you discuss any questions you have with your health care provider. Document Revised: 05/26/2020 Document Reviewed: 05/26/2020 Elsevier Patient Education  2021 [employer].

## 2021-01-18 ENCOUNTER — Encounter: Payer: Self-pay | Admitting: Podiatry

## 2021-01-18 NOTE — Progress Notes (Signed)
  Subjective:  Patient ID: Amy [name], female    DOB: December 30, 1955,  MRN: [id number]  Chief Complaint  Patient presents with  . Nail Problem    Left foot discolored nails     65 y.o. female presents with the above complaint. History confirmed with patient.   Objective:  Physical Exam: warm, good capillary refill, no trophic changes or ulcerative lesions, normal DP and PT pulses and normal sensory exam.  Discolored left hallux and fifth toenails with onycholysis and thickening and yellow discoloration with subungual debris    Assessment:   1. Onychomycosis   2. Tinea pedis of left foot      Plan:  Patient was evaluated and treated and all questions answered.  Discussed treatment options in detail for onychomycosis and onycholysis.  Discussed oral and topical treatment options.  A nail culture for fungal analysis was taken.  This will be sent and analyzed.  Recommend both topical and oral treatment dual therapy.  Prescriptions for Jublia and Lamisil sent to her pharmacy.  Return in about 4 months (around 05/19/2021) for follow up on nail fungus .

## 2021-01-18 NOTE — Telephone Encounter (Signed)
Please advise 

## 2021-02-08 ENCOUNTER — Other Ambulatory Visit: Payer: Self-pay | Admitting: Family Medicine

## 2021-02-08 DIAGNOSIS — Z1231 Encounter for screening mammogram for malignant neoplasm of breast: Secondary | ICD-10-CM

## 2021-04-25 ENCOUNTER — Ambulatory Visit: Payer: BC Managed Care – PPO | Admitting: Podiatry

## 2021-05-16 ENCOUNTER — Ambulatory Visit: Payer: BC Managed Care – PPO | Admitting: Podiatry

## 2021-05-16 ENCOUNTER — Encounter: Payer: Self-pay | Admitting: Podiatry

## 2021-05-16 ENCOUNTER — Other Ambulatory Visit: Payer: Self-pay

## 2021-05-16 DIAGNOSIS — B351 Tinea unguium: Secondary | ICD-10-CM | POA: Diagnosis not present

## 2021-05-17 NOTE — Progress Notes (Signed)
  Subjective:  Patient ID: Amy [name], female    DOB: May 18, 1956,  MRN: [id number]  Chief Complaint  Patient presents with   Nail Problem    4 month follow up nail fungus    65 y.o. female returns for follow-up with the above complaint. History confirmed with patient.  She is doing very well the Jublia has been quite helpful  Objective:  Physical Exam: warm, good capillary refill, no trophic changes or ulcerative lesions, normal DP and PT pulses and normal sensory exam.  Discolored left hallux nail now limited to the proximal distal quadrant      Assessment:   1. Onychomycosis       Plan:  Patient was evaluated and treated and all questions answered.  She is doing very well I recommend she continue the Jublia at this point.  Do not see a reason to restart terbinafine at this point.  She will call if she needs refills.  Return to see me as needed if it does not improve within 3 to 4 months  Return if symptoms worsen or fail to improve.

## 2021-08-24 ENCOUNTER — Ambulatory Visit
Admission: RE | Admit: 2021-08-24 | Discharge: 2021-08-24 | Disposition: A | Discharge status: Home / Self Care | Payer: BC Managed Care – PPO | Source: Ambulatory Visit | Attending: Family Medicine | Admitting: Family Medicine

## 2021-08-24 ENCOUNTER — Other Ambulatory Visit: Payer: Self-pay | Admitting: Family Medicine

## 2021-08-24 DIAGNOSIS — G8929 Other chronic pain: Secondary | ICD-10-CM

## 2021-09-05 ENCOUNTER — Ambulatory Visit
Admission: EM | Admit: 2021-09-05 | Discharge: 2021-09-05 | Disposition: A | Discharge status: Home / Self Care | Payer: BC Managed Care – PPO | Attending: Physician Assistant | Admitting: Physician Assistant

## 2021-09-05 ENCOUNTER — Other Ambulatory Visit: Payer: Self-pay

## 2021-09-05 DIAGNOSIS — J101 Influenza due to other identified influenza virus with other respiratory manifestations: Secondary | ICD-10-CM | POA: Diagnosis not present

## 2021-09-05 DIAGNOSIS — R051 Acute cough: Secondary | ICD-10-CM

## 2021-09-05 LAB — POCT INFLUENZA A/B
Influenza A, POC: POSITIVE — AB
Influenza B, POC: NEGATIVE

## 2021-09-05 MED ORDER — PROMETHAZINE-DM 6.25-15 MG/5ML PO SYRP: 5.0000 mL | ORAL_SOLUTION | Freq: Every evening | ORAL | 0 refills | Status: DC | PRN

## 2021-09-05 MED ORDER — OSELTAMIVIR PHOSPHATE 75 MG PO CAPS: 75.0000 mg | ORAL_CAPSULE | Freq: Two times a day (BID) | ORAL | 0 refills | Status: DC

## 2021-09-05 NOTE — ED Provider Notes (Signed)
[facility]    CSN: [id number] Arrival date & time: 09/05/21  1217      History   Chief Complaint Chief Complaint  Patient presents with   Cough    HPI Amy [name] is a 65 y.o. female.   Patient presents today with a 12-hour history of URI symptoms.  Reports cough, sore throat, subjective fever, fatigue, headache, body aches.  Denies any chest pain, shortness of breath, vomiting, diarrhea.  She has tried Tylenol without improvement of symptoms.  Reports recent sick contacts with grandchildren who have tested positive for influenza A.  She has not had flu shot.  She has had COVID-19 vaccine..  She has not had COVID in the past.  Denies any recent antibiotic use.  Denies any history of asthma, allergies, smoking, COPD.   Past Medical History:  Diagnosis Date   Allergy     Patient Active Problem List   Diagnosis Date Noted   Family history of diabetes mellitus 02/09/2015    Past Surgical History:  Procedure Laterality Date   TONSILLECTOMY  1978    OB History   No obstetric history on file.      Home Medications    Prior to Admission medications   Medication Sig Start Date End Date Taking? Authorizing Provider  oseltamivir (TAMIFLU) 75 MG capsule Take 1 capsule (75 mg total) by mouth every 12 (twelve) hours. 09/05/21  Yes Kenlei Safi K, PA-C  promethazine-dextromethorphan (PROMETHAZINE-DM) 6.25-15 MG/5ML syrup Take 5 mLs by mouth at bedtime as needed for cough. 09/05/21  Yes George Alcantar K, PA-C  diclofenac Sodium (VOLTAREN) 1 % GEL Apply 2 g topically 4 (four) times daily. 04/11/21   [provider]  Efinaconazole (JUBLIA) 10 % SOLN Apply 1 application topically daily. To affected nails 01/17/21   McDonald, Stephan Minister, DPM  fexofenadine (ALLEGRA) 180 MG tablet Take 1 tablet (180 mg total) by mouth daily. 01/05/21   Kennith Gain, MD  ketoconazole (NIZORAL) 2 % cream Apply 1 application topically daily. 01/17/21   McDonald, Stephan Minister,  DPM  levocetirizine (XYZAL) 5 MG tablet Take 1 tablet (5 mg total) by mouth every evening. 09/02/20   Kennith Gain, MD  metoprolol succinate (TOPROL-XL) 25 MG 24 hr tablet Take 25 mg by mouth at bedtime. 02/10/21   [provider]  Multiple Vitamin (MULTIVITAMIN) tablet Take 1 tablet by mouth daily.    [provider]  Olopatadine HCl 0.2 % SOLN Apply 1 drop to eye daily as needed (watery, red, itchy eyes). 01/05/21   Kennith Gain, MD  rosuvastatin (CRESTOR) 20 MG tablet Take 20 mg by mouth at bedtime. 03/08/21   [provider]  sertraline (ZOLOFT) 25 MG tablet SMARTSIG:0.5 Tablet(s) By Mouth Every Evening 04/28/21   [provider]  traZODone (DESYREL) 100 MG tablet Take 50-100 mg by mouth at bedtime as needed. 04/13/21   [provider]  valsartan (DIOVAN) 40 MG tablet SMARTSIG:1 Tablet(s) By Mouth Every Evening 04/05/21   [provider]    Family History Family History  Problem Relation Age of Onset   Diabetes Mother    Diabetes Sister    Diabetes Brother    Colon cancer Neg Hx     Social History Social History   Tobacco Use   Smoking status: Never   Smokeless tobacco: Never  Substance Use Topics   Alcohol use: No    Alcohol/week: 0.0 standard drinks   Drug use: No     Allergies  Patient has no known allergies.   Review of Systems Review of Systems  Constitutional:  Positive for activity change, fatigue and fever. Negative for appetite change.  HENT:  Positive for congestion and sore throat. Negative for sinus pressure and sneezing.   Respiratory:  Positive for cough. Negative for shortness of breath.   Cardiovascular:  Negative for chest pain.  Gastrointestinal:  Positive for nausea. Negative for abdominal pain, diarrhea and vomiting.  Musculoskeletal:  Positive for arthralgias and myalgias.  Neurological:  Positive for headaches. Negative for dizziness and light-headedness.    Physical  Exam Triage Vital Signs ED Triage Vitals  Enc Vitals Group     BP 09/05/21 1344 129/81     Pulse Rate 09/05/21 1344 82     Resp 09/05/21 1344 18     Temp 09/05/21 1344 98.9 F (37.2 C)     Temp Source 09/05/21 1344 Oral     SpO2 09/05/21 1344 94 %     Weight --      Height --      Head Circumference --      Peak Flow --      Pain Score 09/05/21 1346 6     Pain Loc --      Pain Edu? --      Excl. in GC? --    No data found.  Updated Vital Signs BP 129/81 (BP Location: Left Arm)   Pulse 82   Temp 98.9 F (37.2 C) (Oral)   Resp 18   SpO2 94%   Visual Acuity Right Eye Distance:   Left Eye Distance:   Bilateral Distance:    Right Eye Near:   Left Eye Near:    Bilateral Near:     Physical Exam Vitals reviewed.  Constitutional:      General: She is awake. She is not in acute distress.    Appearance: Normal appearance. She is well-developed. She is not ill-appearing.     Comments: Very pleasant female appears stated age no acute distress sitting comfortably in exam room  HENT:     Head: Normocephalic and atraumatic.     Right Ear: Tympanic membrane, ear canal and external ear normal. Tympanic membrane is not erythematous or bulging.     Left Ear: Tympanic membrane, ear canal and external ear normal. Tympanic membrane is not erythematous or bulging.     Nose:     Right Sinus: No maxillary sinus tenderness or frontal sinus tenderness.     Left Sinus: No maxillary sinus tenderness or frontal sinus tenderness.     Mouth/Throat:     Pharynx: Uvula midline. Posterior oropharyngeal erythema present. No oropharyngeal exudate.  Cardiovascular:     Rate and Rhythm: Normal rate and regular rhythm.     Heart sounds: Normal heart sounds, S1 normal and S2 normal. No murmur heard. Pulmonary:     Effort: Pulmonary effort is normal.     Breath sounds: Normal breath sounds. No wheezing, rhonchi or rales.     Comments: Reactive cough with deep breathing Psychiatric:         Behavior: Behavior is cooperative.     UC Treatments / Results  Labs (all labs ordered are listed, but only abnormal results are displayed) Labs Reviewed  POCT INFLUENZA A/B - Abnormal; Notable for the following components:      Result Value   Influenza A, POC Positive (*)    All other components within normal limits    EKG   Radiology No results  found.  Procedures Procedures (including critical care time)  Medications Ordered in UC Medications - No data to display  Initial Impression / Assessment and Plan / UC Course  I have reviewed the triage vital signs and the nursing notes.  Pertinent labs & imaging results that were available during my care of the patient were reviewed by me and considered in my medical decision making (see chart for details).     Patient tested positive for influenza A.  She was started on Tamiflu given she has been symptomatic for less than 48 hours.  She was prescribed Promethazine DM for cough with instruction not to drive or drink alcohol with this medication and use it only at night due to sedation.  Recommended she use Tylenol, Mucinex, Flonase for additional symptom relief.  She is to rest and drink plenty of fluid.  Discussed alarm symptoms that warrant emergent evaluation.  Strict return precautions given to which she expressed understanding.  Final Clinical Impressions(s) / UC Diagnoses   Final diagnoses:  Influenza A  Acute cough     Discharge Instructions      You tested positive for flu.  Please start Tamiflu twice daily.  I have called in a cough medicine that you can use at night.  This will make you sleepy do not drive or drink alcohol while taking it.  Use Tylenol, Mucinex, Flonase for additional symptom relief.  Make sure you are drinking plenty of fluid.  Follow-up with your primary care provider if your symptoms are not resolved within a few days.  If you have any worsening symptoms including high fever, nausea, vomiting,  chest pain, shortness of breath you need to go to the emergency room.     ED Prescriptions     Medication Sig Dispense Auth. Provider   promethazine-dextromethorphan (PROMETHAZINE-DM) 6.25-15 MG/5ML syrup Take 5 mLs by mouth at bedtime as needed for cough. 118 mL Thornton Dohrmann K, PA-C   oseltamivir (TAMIFLU) 75 MG capsule Take 1 capsule (75 mg total) by mouth every 12 (twelve) hours. 10 capsule Aleya Durnell, Derry Skill, PA-C      PDMP not reviewed this encounter.   Terrilee Croak, PA-C 09/05/21 1444

## 2021-09-05 NOTE — ED Triage Notes (Signed)
Onset last night of cough, sore throat and onset this morning of fatigue and HA. Has been taking tylenol w/o relief.  Grand babies dx w/flu.

## 2021-09-05 NOTE — Discharge Instructions (Signed)
You tested positive for flu.  Please start Tamiflu twice daily.  I have called in a cough medicine that you can use at night.  This will make you sleepy do not drive or drink alcohol while taking it.  Use Tylenol, Mucinex, Flonase for additional symptom relief.  Make sure you are drinking plenty of fluid.  Follow-up with your primary care provider if your symptoms are not resolved within a few days.  If you have any worsening symptoms including high fever, nausea, vomiting, chest pain, shortness of breath you need to go to the emergency room.

## 2022-02-07 NOTE — Progress Notes (Signed)
? ?ID:  Amy [name], DOB 02/19/1956, MRN [id number] ? ?PCP:  Fanny Bien, MD  ?Cardiologist:  Rex Kras, DO, [facility]  (established care 02/08/2022) ? ?REASON FOR CONSULT: Hyperlipidemia ? ?REQUESTING PHYSICIAN:  ?Fanny Bien, MD ?[address],  [place] [address] ? ?Chief Complaint  ?Patient presents with  ? New Patient (Initial Visit)  ? Hyperlipidemia  ? Chest Pain  ? ? ?HPI  ?Amy [name] is a 66 y.o.  female whose past medical history and cardiovascular risk factors include: Hypertension, pure hypercholesterolemia, statin intolerance. ? ?She is referred to the office at the request of Fanny Bien, MD for evaluation of hyperlipidemia. ? ?Precordial pain: ?Patient states for the last 3 months she is having chest discomfort, substernally located, intermittent, nonexertional, not associated with resting, self-limited, nonradiating, no improving or worsening factors.  She started walking 2 days ago and has not noticed any exertional chest pain or shortness of breath. ? ?She has a long history of hyperlipidemia which is not well controlled due to his history of statin intolerance.  Patient states that she has tried rosuvastatin, atorvastatin, and pravastatin.  She is now on Zetia and still has similar discomfort but much less in intensity. ? ?FUNCTIONAL STATUS: ?No structured exercise program or daily routine.  ? ?ALLERGIES: ?No Known Allergies ? ?MEDICATION LIST PRIOR TO VISIT: ?Current Meds  ?Medication Sig  ? diclofenac (VOLTAREN) 75 MG EC tablet Take 75 mg by mouth daily.  ? Efinaconazole (JUBLIA) 10 % SOLN Apply 1 application topically daily. To affected nails  ? Evolocumab (REPATHA SURECLICK) 496 MG/ML SOAJ Inject 140 mg into the skin every 14 (fourteen) days for 6 doses.  ? ezetimibe (ZETIA) 10 MG tablet Take 10 mg by mouth at bedtime.  ? fexofenadine (ALLEGRA) 180 MG tablet Take 1 tablet (180 mg total) by mouth daily.  ? Multiple Vitamin (MULTIVITAMIN) tablet Take 1 tablet  by mouth daily.  ? Olopatadine HCl 0.2 % SOLN Apply 1 drop to eye daily as needed (watery, red, itchy eyes).  ? sertraline (ZOLOFT) 25 MG tablet SMARTSIG:0.5 Tablet(s) By Mouth Every Evening  ? valsartan (DIOVAN) 40 MG tablet SMARTSIG:1 Tablet(s) By Mouth Every Evening  ?  ? ?PAST MEDICAL HISTORY: ?Past Medical History:  ?Diagnosis Date  ? Allergy   ? Hyperlipidemia   ? Hypertension   ? Statin intolerance   ? ? ?PAST SURGICAL HISTORY: ?Past Surgical History:  ?Procedure Laterality Date  ? TONSILLECTOMY  1978  ? ? ?FAMILY HISTORY: ?The patient family history includes Diabetes in her brother, mother, and sister. ? ?SOCIAL HISTORY:  ?The patient  reports that she has never smoked. She has never used smokeless tobacco. She reports that she does not drink alcohol and does not use drugs. ? ?REVIEW OF SYSTEMS: ?Review of Systems  ?Cardiovascular:  Positive for chest pain. Negative for dyspnea on exertion, leg swelling, near-syncope, orthopnea, palpitations, paroxysmal nocturnal dyspnea and syncope.  ?Respiratory:  Negative for shortness of breath.   ? ?PHYSICAL EXAM: ? ?  02/08/2022  ?  2:41 PM 09/05/2021  ?  1:44 PM 01/05/2021  ?  4:49 PM  ?Vitals with BMI  ?Height 5' 3"  5' 3"  ?Weight 151 lbs  146 lbs 3 oz  ?BMI 26.76  25.9  ?Systolic 759 163 846  ?Diastolic 78 81 68  ?Pulse 80 82 75  ? ? ?CONSTITUTIONAL: Well-developed and well-nourished. No acute distress.  ?SKIN: Skin is warm and dry. No rash noted. No cyanosis. No pallor. No jaundice ?  HEAD: Normocephalic and atraumatic.  ?EYES: No scleral icterus ?MOUTH/THROAT: Moist oral membranes.  ?NECK: No JVD present. No thyromegaly noted. No carotid bruits  ?LYMPHATIC: No visible cervical adenopathy.  ?CHEST Normal respiratory effort. No intercostal retractions  ?LUNGS: Clear to auscultation bilaterally.  No stridor. No wheezes. No rales.  ?CARDIOVASCULAR: Regular rate and rhythm, positive S1-S2, no murmurs rubs or gallops appreciated. ?ABDOMINAL: soft, nontender, nondistended,  positive bowel sounds all 4 quadrants. No apparent ascites.  ?EXTREMITIES: No peripheral edema, warm to touch, 2+ bilateral DP and PT pulses ?HEMATOLOGIC: No significant bruising ?NEUROLOGIC: Oriented to person, place, and time. Nonfocal. Normal muscle tone.  ?PSYCHIATRIC: Normal mood and affect. Normal behavior. Cooperative ? ?CARDIAC DATABASE: ?EKG: ?February 08, 2022: Normal sinus rhythm, 74 bpm, normal axis, without underlying ischemia injury pattern.  ? ?Echocardiogram: ?No results found for this or any previous visit from the past 1095 days. ?  ? ?Stress Testing: ?No results found for this or any previous visit from the past 1095 days. ? ? ?Heart Catheterization: ?None ? ?LABORATORY DATA: ? ?  Latest Ref Rng & Units 02/12/2018  ?  4:08 PM  ?CBC  ?WBC 4.0 - 10.5 K/uL 7.0    ?Hemoglobin 12.0 - 15.0 g/dL 13.1    ?Hematocrit 36.0 - 46.0 % 39.9    ?Platelets 150 - 400 K/uL 318    ? ? ? ?  Latest Ref Rng & Units 02/12/2018  ?  4:08 PM  ?CMP  ?Glucose 65 - 99 mg/dL 108    ?BUN 6 - 20 mg/dL 13    ?Creatinine 0.44 - 1.00 mg/dL 0.72    ?Sodium 135 - 145 mmol/L 135    ?Potassium 3.5 - 5.1 mmol/L 3.7    ?Chloride 101 - 111 mmol/L 99    ?CO2 22 - 32 mmol/L 24    ?Calcium 8.9 - 10.3 mg/dL 9.8    ?Total Protein 6.5 - 8.1 g/dL 7.5    ?Total Bilirubin 0.3 - 1.2 mg/dL 0.7    ?Alkaline Phos 38 - 126 U/L 70    ?AST 15 - 41 U/L 29    ?ALT 14 - 54 U/L 26    ? ? ?Lipid Panel  ?No results found for: CHOL, TRIG, HDL, CHOLHDL, VLDL, LDLCALC, LDLDIRECT, LABVLDL ? ?No components found for: NTPROBNP ?No results for input(s): PROBNP in the last 8760 hours. ?No results for input(s): TSH in the last 8760 hours. ? ?BMP ?No results for input(s): NA, K, CL, CO2, GLUCOSE, BUN, CREATININE, CALCIUM, GFRNONAA, GFRAA in the last 8760 hours. ? ?HEMOGLOBIN A1C ?No results found for: HGBA1C, MPG ? ?External Labs:  ?Date Collected: 01/10/2022 , information obtained by referring provider ?Potassium: 4.2 ?Creatinine 0.56 mg/dL. ?eGFR: 97 mL/min per 1.73  m? ?Hemoglobin: 12.9 g/dL and hematocrit: 37.5 % ?Lipid profile: Total cholesterol 347 , triglycerides 170 , HDL 61 , LDL 252, non-HDL 286 ?AST: 27 , ALT: 23 , alkaline phosphatase: 70  ?Hemoglobin A1c: 5.7 ?TSH: 1.23   ? ?IMPRESSION: ? ?  ICD-10-CM   ?1. Precordial pain  R07.2 PCV ECHOCARDIOGRAM COMPLETE  ?  PCV CARDIAC STRESS TEST  ?  CT CARDIAC SCORING ([facility] LOCATIONS ONLY)  ?  ?2. Pure hypercholesterolemia  E78.00 ezetimibe (ZETIA) 10 MG tablet  ?  CT CARDIAC SCORING ([facility] LOCATIONS ONLY)  ?  Evolocumab (REPATHA SURECLICK) 481 MG/ML SOAJ  ?  ?3. Statin intolerance  Z78.9   ?  ?4. Primary hypertension  I10 EKG 12-Lead  ?  ?  ? ?RECOMMENDATIONS: ?Amy  [name] is a 66 y.o.  female whose past medical history and cardiac risk factors include: Hypertension, pure hypercholesterolemia, statin intolerance. ? ?Precordial pain ?Likely noncardiac based on symptoms. ?EKG nonischemic. ?Echo will be ordered to evaluate for structural heart disease and left ventricular systolic function. ?GXT to evaluate for functional status and exercise-induced ischemia. ? ?Pure hypercholesterolemia / Statin intolerance ?Patient has been on rosuvastatin, atorvastatin, pravastatin all of which caused myalgias according to the patient. ?And now referred to cardiology for management of hyperlipidemia. ?Coronary calcium score for further risk stratification ?Start Repatha 140 mgSubcu q. 14 days. ?If and when Repatha is approved patient is asked to come in for medication administration education. ?If the Repatha is too expensive or not covered by insurance patient is asked to inform us. ? ?Primary hypertension ?Office blood pressures are very well controlled. ?Continue current medical therapy. ?Low-salt diet recommended ? ?As part of today's office visit reviewed outside records provided by PCP, independently reviewed labs which are referenced above, ordered and independently reviewed EKG, ordered additional diagnostic work-up and  pharmacological therapy as noted above.  Further recommendations to follow. ? ?FINAL MEDICATION LIST END OF ENCOUNTER: ?Meds ordered this encounter  ?Medications  ? Evolocumab (REPATHA SURECLICK) 520 MG/ML SOAJ  ?  Si

## 2022-02-08 ENCOUNTER — Other Ambulatory Visit: Payer: Self-pay | Admitting: Cardiology

## 2022-02-08 ENCOUNTER — Encounter: Payer: Self-pay | Admitting: Cardiology

## 2022-02-08 ENCOUNTER — Ambulatory Visit: Payer: BC Managed Care – PPO | Admitting: Cardiology

## 2022-02-08 VITALS — BP 128/78 | HR 80 | Temp 98.0°F | Resp 17 | Ht 63.0 in | Wt 151.0 lb

## 2022-02-08 DIAGNOSIS — E7800 Pure hypercholesterolemia, unspecified: Secondary | ICD-10-CM

## 2022-02-08 DIAGNOSIS — I10 Essential (primary) hypertension: Secondary | ICD-10-CM

## 2022-02-08 DIAGNOSIS — R072 Precordial pain: Secondary | ICD-10-CM

## 2022-02-08 DIAGNOSIS — Z789 Other specified health status: Secondary | ICD-10-CM

## 2022-02-08 MED ORDER — REPATHA SURECLICK 140 MG/ML ~~LOC~~ SOAJ: 140.0000 mg | SUBCUTANEOUS | 3 refills | Status: AC

## 2022-02-13 ENCOUNTER — Ambulatory Visit: Payer: BC Managed Care – PPO

## 2022-02-13 DIAGNOSIS — R072 Precordial pain: Secondary | ICD-10-CM

## 2022-03-07 ENCOUNTER — Ambulatory Visit
Admission: RE | Admit: 2022-03-07 | Discharge: 2022-03-07 | Disposition: A | Discharge status: Home / Self Care | Payer: No Typology Code available for payment source | Source: Ambulatory Visit | Attending: Cardiology | Admitting: Cardiology

## 2022-03-07 DIAGNOSIS — E7800 Pure hypercholesterolemia, unspecified: Secondary | ICD-10-CM

## 2022-03-07 DIAGNOSIS — R072 Precordial pain: Secondary | ICD-10-CM

## 2022-03-16 ENCOUNTER — Ambulatory Visit: Payer: BC Managed Care – PPO

## 2022-03-16 DIAGNOSIS — R072 Precordial pain: Secondary | ICD-10-CM

## 2022-03-16 LAB — PCV CARDIAC STRESS TEST
Angina Index: 0
Base ST Depression (mm): 1.5 mm
ST Depression (mm): 1.5 mm

## 2022-03-23 ENCOUNTER — Ambulatory Visit: Payer: BC Managed Care – PPO | Admitting: Cardiology

## 2022-03-30 ENCOUNTER — Ambulatory Visit: Payer: BC Managed Care – PPO | Admitting: Cardiology

## 2022-03-30 ENCOUNTER — Encounter: Payer: Self-pay | Admitting: Cardiology

## 2022-03-30 VITALS — BP 122/72 | HR 70 | Temp 97.9°F | Resp 16 | Ht 63.0 in | Wt 151.2 lb

## 2022-03-30 DIAGNOSIS — R911 Solitary pulmonary nodule: Secondary | ICD-10-CM

## 2022-03-30 DIAGNOSIS — Z789 Other specified health status: Secondary | ICD-10-CM

## 2022-03-30 DIAGNOSIS — I10 Essential (primary) hypertension: Secondary | ICD-10-CM

## 2022-03-30 DIAGNOSIS — R072 Precordial pain: Secondary | ICD-10-CM

## 2022-03-30 DIAGNOSIS — E7800 Pure hypercholesterolemia, unspecified: Secondary | ICD-10-CM

## 2022-03-30 NOTE — Progress Notes (Signed)
ID:  Amy [name], DOB [birth year], MRN [id number]  PCP:  Fanny Bien, MD  Cardiologist:  Rex Kras, DO, [facility]  (established care 02/08/2022)  Date: 03/30/22 Last Office Visit: 02/08/2022  Chief Complaint  Patient presents with   Results   Follow-up    HPI  Amy [name] is a 66 y.o.  female whose past medical history and cardiovascular risk factors include: Hypertension, pure hypercholesterolemia, statin intolerance.  Patient was referred to the practice for evaluation and management of hyperlipidemia.  Patient states that she has a long history of hyperlipidemia and due to statin intolerance she was not on pharmacological therapy.  In the past she has tried Crestor, atorvastatin, and pravastatin according to her memory.  At the last visit I educated her on the importance of increasing physical activity, reducing foods that are high in caloric intake/lipids, and prescribed Repatha.  With lifestyle changes patient has improved her lipids but they are still not at goal.  Outside labs reviewed independently and noted below for further reference.  At last visit she also was complaining of precordial discomfort which appeared to be noncardiac based on symptoms.  Echocardiogram noted preserved LVEF and treadmill stress test independently reviewed which notes low risk study.  She is working out every day 30 to 45 minutes and does not have any exertional chest pain or heart failure symptoms.  FUNCTIONAL STATUS: Works out every day 30 to 45 minutes.  ALLERGIES: No Known Allergies  MEDICATION LIST PRIOR TO VISIT: Current Meds  Medication Sig   diclofenac (VOLTAREN) 75 MG EC tablet Take 75 mg by mouth daily.   Evolocumab (REPATHA SURECLICK) 376 MG/ML SOAJ Inject 140 mg into the skin every 14 (fourteen) days for 6 doses.   ezetimibe (ZETIA) 10 MG tablet Take 10 mg by mouth at bedtime.   fexofenadine (ALLEGRA) 180 MG tablet Take 1 tablet (180 mg total) by mouth daily.    Multiple Vitamin (MULTIVITAMIN) tablet Take 1 tablet by mouth daily.   Olopatadine HCl 0.2 % SOLN Apply 1 drop to eye daily as needed (watery, red, itchy eyes).   sertraline (ZOLOFT) 25 MG tablet SMARTSIG:0.5 Tablet(s) By Mouth Every Evening   valsartan (DIOVAN) 40 MG tablet SMARTSIG:1 Tablet(s) By Mouth Every Evening     PAST MEDICAL HISTORY: Past Medical History:  Diagnosis Date   Allergy    Hyperlipidemia    Hypertension    Statin intolerance     PAST SURGICAL HISTORY: Past Surgical History:  Procedure Laterality Date   TONSILLECTOMY  1978    FAMILY HISTORY: The patient family history includes Diabetes in her brother, mother, and sister.  SOCIAL HISTORY:  The patient  reports that she has never smoked. She has never used smokeless tobacco. She reports that she does not drink alcohol and does not use drugs.  REVIEW OF SYSTEMS: Review of Systems  Cardiovascular:  Negative for chest pain, dyspnea on exertion, leg swelling, near-syncope, orthopnea, palpitations, paroxysmal nocturnal dyspnea and syncope.  Respiratory:  Negative for shortness of breath.     PHYSICAL EXAM:    03/30/2022    2:28 PM 02/08/2022    2:41 PM 09/05/2021    1:44 PM  Vitals with BMI  Height '5\' 3"'  '5\' 3"'    Weight 151 lbs 3 oz 151 lbs   BMI 28.31 51.76   Systolic 160 737 106  Diastolic 72 78 81  Pulse 70 80 82    CONSTITUTIONAL: Well-developed and well-nourished. No acute distress.  SKIN: Skin is warm and  dry. No rash noted. No cyanosis. No pallor. No jaundice HEAD: Normocephalic and atraumatic.  EYES: No scleral icterus MOUTH/THROAT: Moist oral membranes.  NECK: No JVD present. No thyromegaly noted. No carotid bruits  CHEST Normal respiratory effort. No intercostal retractions  LUNGS: Clear to auscultation bilaterally.  No stridor. No wheezes. No rales.  CARDIOVASCULAR: Regular rate and rhythm, positive S1-S2, no murmurs rubs or gallops appreciated. ABDOMINAL: soft, nontender, nondistended,  positive bowel sounds all 4 quadrants. No apparent ascites.  EXTREMITIES: No peripheral edema, warm to touch, 2+ bilateral DP and PT pulses HEMATOLOGIC: No significant bruising NEUROLOGIC: Oriented to person, place, and time. Nonfocal. Normal muscle tone.  PSYCHIATRIC: Normal mood and affect. Normal behavior. Cooperative. No significant change from the last office visit  CARDIAC DATABASE: EKG: February 08, 2022: Normal sinus rhythm, 74 bpm, normal axis, without underlying ischemia injury pattern.   Echocardiogram: 02/13/2022: Normal LV systolic function with visual EF 60-65%. Left ventricle cavity is normal in size. Normal left ventricular wall thickness. No obvious regional wall motion abnormalities. Normal global wall motion. Normal diastolic filling pattern, normal LAP.  No significant valvular heart disease. No prior study for comparison.    Stress Testing: Exercise treadmill stress test 03/16/2022: Exercise treadmill stress test performed using Bruce protocol. Patient reached 7 METS, and 110% of age predicted maximum heart rate. Exercise capacity was fair. No chest pain reported. Normal heart rate and hemodynamic response.  Stress EKG showed sinus tachycardia, 1-1.5 mm downsloping ST depression in leads II, III, aVF, V5, V6 that recover within 1 min into recovery. These changes are equivocal for ischemia. Recommend clinical correlation.  Coronary artery calcium score May 2023: 1. Coronary calcium score is 0. 2. Small hiatal hernia. 3. Chronic densities at the anterior left lung base involving the lingula and left lower lobe. Findings are most compatible with scarring. 4. 3 mm nodular density along the right minor fissure. If patient is low risk for malignancy, no routine follow-up imaging is recommended; if patient is high risk for malignancy, a non-contrast Chest CT at 12 months is optional. If performed and the nodule is stable at 12 months, no further follow-up is recommended. These  guidelines do not apply to immunocompromised patients and patients with cancer. Follow up in patients with significant comorbidities as clinically warranted. For lung cancer screening, adhere to Lung-RADS guidelines. Reference: Radiology. 2017; 284(1):228-43.  Heart Catheterization: None  LABORATORY DATA:    Latest Ref Rng & Units 02/12/2018    4:08 PM  CBC  WBC 4.0 - 10.5 K/uL 7.0   Hemoglobin 12.0 - 15.0 g/dL 13.1   Hematocrit 36.0 - 46.0 % 39.9   Platelets 150 - 400 K/uL 318        Latest Ref Rng & Units 02/12/2018    4:08 PM  CMP  Glucose 65 - 99 mg/dL 108   BUN 6 - 20 mg/dL 13   Creatinine 0.44 - 1.00 mg/dL 0.72   Sodium 135 - 145 mmol/L 135   Potassium 3.5 - 5.1 mmol/L 3.7   Chloride 101 - 111 mmol/L 99   CO2 22 - 32 mmol/L 24   Calcium 8.9 - 10.3 mg/dL 9.8   Total Protein 6.5 - 8.1 g/dL 7.5   Total Bilirubin 0.3 - 1.2 mg/dL 0.7   Alkaline Phos 38 - 126 U/L 70   AST 15 - 41 U/L 29   ALT 14 - 54 U/L 26     Lipid Panel  No results found for: "CHOL", "TRIG", "HDL", "CHOLHDL", "  VLDL", "LDLCALC", "LDLDIRECT", "LABVLDL"  No components found for: "NTPROBNP" No results for input(s): "PROBNP" in the last 8760 hours. No results for input(s): "TSH" in the last 8760 hours.  BMP No results for input(s): "NA", "K", "CL", "CO2", "GLUCOSE", "BUN", "CREATININE", "CALCIUM", "GFRNONAA", "GFRAA" in the last 8760 hours.  HEMOGLOBIN A1C No results found for: "HGBA1C", "MPG"  External Labs:  Date Collected: 01/10/2022 , information obtained by referring provider Potassium: 4.2 Creatinine 0.56 mg/dL. eGFR: 97 mL/min per 1.73 m Hemoglobin: 12.9 g/dL and hematocrit: 37.5 % Lipid profile: Total cholesterol 347 , triglycerides 170 , HDL 61 , LDL 252, non-HDL 286 AST: 27 , ALT: 23 , alkaline phosphatase: 70  Hemoglobin A1c: 5.7 TSH: 1.23    External Labs: Collected: March 30, 2022 provided by PCP. Sodium 139, potassium 4.4, chloride 103, bicarb 27, BUN 13, creatinine 0.59. AST 26,  ALT 20, alkaline phosphatase 71 TSH 1.24 Total cholesterol 272, triglycerides 125, HDL 63, non-HDL 209, calculated LDL 184. Hemoglobin 12.3 g/dL, hematocrit 35.9%   IMPRESSION:    ICD-10-CM   1. Pure hypercholesterolemia  E78.00     2. Statin intolerance  Z78.9     3. Precordial pain  R07.2     4. Primary hypertension  I10     5. Pulmonary nodule  R91.1        RECOMMENDATIONS: Amy [name] is a 66 y.o.  female whose past medical history and cardiac risk factors include: Hypertension, pure hypercholesterolemia, statin intolerance.  Pure hypercholesterolemia Improving. March 2023 LDL 252 mg/dL, June 2023 LDL calculated 184 mg/dL Continue Zetia Patient is currently not on Repatha -we will follow-up to see if she needs a prior authorization. Once Repatha is provided to the patient she is asked to come in for nurse visit for administration education Recommend repeating fasting lipid profile and LFTs after 3 doses of Repatha and prior to the next office visit  Statin intolerance According the patient she has tried rosuvastatin, atorvastatin, pravastatin in the past all of which have caused myalgias.  Precordial pain Results of the last office visit. Results of echo and GXT reviewed with the patient at today's office visit. No additional cardiovascular testing warranted at this time as she is asymptomatic.  However, if the symptoms resurface would recommend additional work-up.  Patient is agreeable with the plan of care.  Primary hypertension Office blood pressures are well controlled. Medications reconciled. Currently managed by primary care provider.  Pulmonary nodule Coronary calcium score from May 2023 reported a 3 mm nodular density along the right minor fissure.  Patient states that she is a non-smoker.  Low pretest probability for malignancy based on size and history.  However, I have asked her to discuss the findings with PCP and see if additional work-up is  warranted.  Patient is agreeable with the plan of care.   FINAL MEDICATION LIST END OF ENCOUNTER: No orders of the defined types were placed in this encounter.   Medications Discontinued During This Encounter  Medication Reason   Efinaconazole (JUBLIA) 10 % SOLN      Current Outpatient Medications:    diclofenac (VOLTAREN) 75 MG EC tablet, Take 75 mg by mouth daily., Disp: , Rfl:    Evolocumab (REPATHA SURECLICK) 259 MG/ML SOAJ, Inject 140 mg into the skin every 14 (fourteen) days for 6 doses., Disp: 2 mL, Rfl: 3   ezetimibe (ZETIA) 10 MG tablet, Take 10 mg by mouth at bedtime., Disp: , Rfl:    fexofenadine (ALLEGRA) 180 MG tablet, Take 1  tablet (180 mg total) by mouth daily., Disp: 1 tablet, Rfl: 5   Multiple Vitamin (MULTIVITAMIN) tablet, Take 1 tablet by mouth daily., Disp: , Rfl:    Olopatadine HCl 0.2 % SOLN, Apply 1 drop to eye daily as needed (watery, red, itchy eyes)., Disp: 2.5 mL, Rfl: 5   sertraline (ZOLOFT) 25 MG tablet, SMARTSIG:0.5 Tablet(s) By Mouth Every Evening, Disp: , Rfl:    valsartan (DIOVAN) 40 MG tablet, SMARTSIG:1 Tablet(s) By Mouth Every Evening, Disp: , Rfl:   No orders of the defined types were placed in this encounter.   There are no Patient Instructions on file for this visit.   --Continue cardiac medications as reconciled in final medication list. --Return in about 3 months (around 06/30/2022) for Follow up, Lipid. Or sooner if needed. --Continue follow-up with your primary care physician regarding the management of your other chronic comorbid conditions.  Patient's questions and concerns were addressed to her satisfaction. She voices understanding of the instructions provided during this encounter.   This note was created using a voice recognition software as a result there may be grammatical errors inadvertently enclosed that do not reflect the nature of this encounter. Every attempt is made to correct such errors.  Rex Kras, [place], [facility]  Pager:  [phone] Office: [phone]

## 2022-06-27 ENCOUNTER — Other Ambulatory Visit: Payer: Self-pay

## 2022-06-27 DIAGNOSIS — E7800 Pure hypercholesterolemia, unspecified: Secondary | ICD-10-CM

## 2022-06-29 ENCOUNTER — Ambulatory Visit: Payer: BC Managed Care – PPO | Admitting: Cardiology

## 2022-07-13 LAB — CMP14+EGFR
ALT: 22 IU/L (ref 0–32)
AST: 25 IU/L (ref 0–40)
Albumin/Globulin Ratio: 2.4 — ABNORMAL HIGH (ref 1.2–2.2)
Albumin: 4.7 g/dL (ref 3.9–4.9)
Alkaline Phosphatase: 73 IU/L (ref 44–121)
BUN/Creatinine Ratio: 22 (ref 12–28)
BUN: 15 mg/dL (ref 8–27)
Bilirubin Total: 0.4 mg/dL (ref 0.0–1.2)
CO2: 22 mmol/L (ref 20–29)
Calcium: 10.4 mg/dL — ABNORMAL HIGH (ref 8.7–10.3)
Chloride: 101 mmol/L (ref 96–106)
Creatinine, Ser: 0.67 mg/dL (ref 0.57–1.00)
Globulin, Total: 2 g/dL (ref 1.5–4.5)
Glucose: 95 mg/dL (ref 70–99)
Potassium: 4.5 mmol/L (ref 3.5–5.2)
Sodium: 140 mmol/L (ref 134–144)
Total Protein: 6.7 g/dL (ref 6.0–8.5)
eGFR: 96 mL/min/{1.73_m2} (ref >59)

## 2022-07-13 LAB — LIPID PANEL WITH LDL/HDL RATIO
Cholesterol, Total: 262 mg/dL — ABNORMAL HIGH (ref 100–199)
HDL: 61 mg/dL (ref >39)
LDL Chol Calc (NIH): 182 mg/dL — ABNORMAL HIGH (ref 0–99)
LDL/HDL Ratio: 3 ratio (ref 0.0–3.2)
Triglycerides: 107 mg/dL (ref 0–149)
VLDL Cholesterol Cal: 19 mg/dL (ref 5–40)

## 2022-07-17 ENCOUNTER — Encounter: Payer: Self-pay | Admitting: Cardiology

## 2022-07-17 ENCOUNTER — Ambulatory Visit: Payer: BC Managed Care – PPO | Admitting: Cardiology

## 2022-07-17 VITALS — BP 115/70 | HR 76 | Temp 97.9°F | Resp 16 | Ht 63.0 in | Wt 147.4 lb

## 2022-07-17 DIAGNOSIS — R072 Precordial pain: Secondary | ICD-10-CM

## 2022-07-17 DIAGNOSIS — I10 Essential (primary) hypertension: Secondary | ICD-10-CM

## 2022-07-17 DIAGNOSIS — E7801 Familial hypercholesterolemia: Secondary | ICD-10-CM

## 2022-07-17 DIAGNOSIS — E78019 Familial hypercholesterolemia, unspecified: Secondary | ICD-10-CM

## 2022-07-17 DIAGNOSIS — Z789 Other specified health status: Secondary | ICD-10-CM

## 2022-07-17 DIAGNOSIS — E7800 Pure hypercholesterolemia, unspecified: Secondary | ICD-10-CM

## 2022-07-17 MED ORDER — REPATHA SURECLICK 140 MG/ML ~~LOC~~ SOAJ: 140.0000 mg | Freq: Every day | SUBCUTANEOUS | 3 refills | Status: DC

## 2022-07-17 NOTE — Progress Notes (Signed)
ID:  Amy [name], DOB 1956-09-12, MRN [id number]  PCP:  Fanny Bien, MD  Cardiologist:  Rex Kras, DO, [facility]  (established care 02/08/2022)  Date: 07/17/22 Last Office Visit: 03/30/2022  Chief Complaint  Patient presents with   Hyperlipidemia   Follow-up    HPI  Amy [name] is a 66 y.o.  female whose past medical history and cardiovascular risk factors include: Hypertension, pure hypercholesterolemia, statin intolerance.  Patient was referred to the practice for evaluation and management of hyperlipidemia and statin intolerance.  In addition she was also endorsing precordial discomfort at the last office visit and therefore recommended undergoing echo and GXT.  Echocardiogram notes preserved LVEF and exercise stress test personally reviewed and overall low risk study.  Since last office visit patient has not had any reoccurrence of anginal discomfort.  Her coronary calcium score is also 0.  She has not been approved for Repatha despite her lipid levels.  In March 2023 her LDL was reported to be 252 mg/dL.  After implementing lifestyle changes and Zetia 10 mg p.o. daily LDL as of June 2023 was 184 mg/dL.  In the past she has tried rosuvastatin, atorvastatin, pravastatin all of which have caused her myalgias in the past.  FUNCTIONAL STATUS: No structured exercise program or daily routine.   ALLERGIES: No Known Allergies  MEDICATION LIST PRIOR TO VISIT: Current Meds  Medication Sig   Cholecalciferol (VITAMIN D3) 50 MCG (2000 UT) capsule Take 2,000 Units by mouth daily.   diclofenac (VOLTAREN) 75 MG EC tablet Take 75 mg by mouth daily.   Evolocumab (REPATHA SURECLICK) 361 MG/ML SOAJ Inject 140 mg into the skin daily at 2 PM for 6 doses.   ezetimibe (ZETIA) 10 MG tablet Take 10 mg by mouth at bedtime.   fexofenadine (ALLEGRA) 180 MG tablet Take 1 tablet (180 mg total) by mouth daily.   Multiple Vitamin (MULTIVITAMIN) tablet Take 1 tablet by mouth daily.    Olopatadine HCl 0.2 % SOLN Apply 1 drop to eye daily as needed (watery, red, itchy eyes).   sertraline (ZOLOFT) 25 MG tablet SMARTSIG:0.5 Tablet(s) By Mouth Every Evening   valsartan (DIOVAN) 40 MG tablet SMARTSIG:1 Tablet(s) By Mouth Every Evening     PAST MEDICAL HISTORY: Past Medical History:  Diagnosis Date   Allergy    Hyperlipidemia    Hypertension    Statin intolerance     PAST SURGICAL HISTORY: Past Surgical History:  Procedure Laterality Date   TONSILLECTOMY  1978    FAMILY HISTORY: The patient family history includes Diabetes in her brother, mother, and sister.  SOCIAL HISTORY:  The patient  reports that she has never smoked. She has never used smokeless tobacco. She reports that she does not drink alcohol and does not use drugs.  REVIEW OF SYSTEMS: Review of Systems  Cardiovascular:  Negative for chest pain, dyspnea on exertion, leg swelling, near-syncope, orthopnea, palpitations, paroxysmal nocturnal dyspnea and syncope.  Respiratory:  Negative for shortness of breath.     PHYSICAL EXAM:    07/17/2022    3:23 PM 03/30/2022    2:28 PM 02/08/2022    2:41 PM  Vitals with BMI  Height '5\' 3"'  '5\' 3"'  '5\' 3"'   Weight 147 lbs 6 oz 151 lbs 3 oz 151 lbs  BMI 26.12 44.31 54.00  Systolic 867 619 509  Diastolic 70 72 78  Pulse 76 70 80    CONSTITUTIONAL: Well-developed and well-nourished. No acute distress.  SKIN: Skin is warm and dry. No rash  noted. No cyanosis. No pallor. No jaundice HEAD: Normocephalic and atraumatic.  EYES: No scleral icterus MOUTH/THROAT: Moist oral membranes.  NECK: No JVD present. No thyromegaly noted. No carotid bruits  CHEST Normal respiratory effort. No intercostal retractions  LUNGS: Clear to auscultation bilaterally.  No stridor. No wheezes. No rales.  CARDIOVASCULAR: Regular rate and rhythm, positive S1-S2, no murmurs rubs or gallops appreciated. ABDOMINAL: soft, nontender, nondistended, positive bowel sounds all 4 quadrants. No apparent  ascites.  EXTREMITIES: No peripheral edema, warm to touch, 2+ bilateral DP and PT pulses HEMATOLOGIC: No significant bruising NEUROLOGIC: Oriented to person, place, and time. Nonfocal. Normal muscle tone.  PSYCHIATRIC: Normal mood and affect. Normal behavior. Cooperative  CARDIAC DATABASE: EKG: February 08, 2022: Normal sinus rhythm, 74 bpm, normal axis, without underlying ischemia injury pattern.   Echocardiogram: 02/13/2022:  Normal LV systolic function with visual EF 60-65%. Left ventricle cavity is normal in size. Normal left ventricular wall thickness. No obvious regional wall motion abnormalities. Normal global wall motion. Normal diastolic filling pattern, normal LAP.  No significant valvular heart disease.  No prior study for comparison.    Stress Testing: Exercise treadmill stress test 03/16/2022: Exercise treadmill stress test performed using Bruce protocol.  Patient reached 7 METS, and 110% of age predicted maximum heart rate.  Exercise capacity was fair.  No chest pain reported.  Normal heart rate and hemodynamic response.  Stress EKG showed sinus tachycardia, 1-1.5 mm downsloping ST depression in leads II, III, aVF, V5, V6 that recover within 1 min into recovery. These changes are equivocal for ischemia. Recommend clinical correlation.  Heart Catheterization: None  Coronary calcium score: 03/07/2022 Total coronary calcium score of 0 2.   Small hiatal hernia. 3.   Chronic densities at the anterior left lung base involving the lingula and left lower lobe. Findings are most compatible with scarring. 4. 3 mm nodular density along the right minor fissure. If patient is low risk for malignancy, no routine follow-up imaging is recommended; if patient is high risk for malignancy, a non-contrast Chest CT at 12 months is optional. If performed and the nodule is stable at 12 months, no further follow-up is recommended. These guidelines do not apply to immunocompromised patients and  patients with cancer. Follow up in patients with significant comorbidity as clinically warranted. For lung cancer screening, adhere to Lung-RADS guidelines. Reference: Radiology. 2017; 284(1):228-43.  LABORATORY DATA:    Latest Ref Rng & Units 02/12/2018    4:08 PM  CBC  WBC 4.0 - 10.5 K/uL 7.0   Hemoglobin 12.0 - 15.0 g/dL 13.1   Hematocrit 36.0 - 46.0 % 39.9   Platelets 150 - 400 K/uL 318        Latest Ref Rng & Units 07/12/2022    9:51 AM 02/12/2018    4:08 PM  CMP  Glucose 70 - 99 mg/dL 95  108   BUN 8 - 27 mg/dL 15  13   Creatinine 0.57 - 1.00 mg/dL 0.67  0.72   Sodium 134 - 144 mmol/L 140  135   Potassium 3.5 - 5.2 mmol/L 4.5  3.7   Chloride 96 - 106 mmol/L 101  99   CO2 20 - 29 mmol/L 22  24   Calcium 8.7 - 10.3 mg/dL 10.4  9.8   Total Protein 6.0 - 8.5 g/dL 6.7  7.5   Total Bilirubin 0.0 - 1.2 mg/dL 0.4  0.7   Alkaline Phos 44 - 121 IU/L 73  70   AST 0 - 40  IU/L 25  29   ALT 0 - 32 IU/L 22  26     Lipid Panel     Component Value Date/Time   CHOL 262 (H) 07/12/2022 0952   TRIG 107 07/12/2022 0952   HDL 61 07/12/2022 0952   LDLCALC 182 (H) 07/12/2022 0952   LABVLDL 19 07/12/2022 0952    No components found for: "NTPROBNP" No results for input(s): "PROBNP" in the last 8760 hours. No results for input(s): "TSH" in the last 8760 hours.  BMP Recent Labs    07/12/22 0951  NA 140  K 4.5  CL 101  CO2 22  GLUCOSE 95  BUN 15  CREATININE 0.67  CALCIUM 10.4*    HEMOGLOBIN A1C No results found for: "HGBA1C", "MPG"  External Labs:  Date Collected: 01/10/2022 , information obtained by referring provider Potassium: 4.2 Creatinine 0.56 mg/dL. eGFR: 97 mL/min per 1.73 m Hemoglobin: 12.9 g/dL and hematocrit: 37.5 % Lipid profile: Total cholesterol 347 , triglycerides 170 , HDL 61 , LDL 252, non-HDL 286 AST: 27 , ALT: 23 , alkaline phosphatase: 70  Hemoglobin A1c: 5.7 TSH: 1.23    IMPRESSION:    ICD-10-CM   1. Precordial pain  R07.2     2. Familial  hypercholesterolemia  E78.01 Evolocumab (REPATHA SURECLICK) 588 MG/ML SOAJ    3. Pure hypercholesterolemia  E78.00 Evolocumab (REPATHA SURECLICK) 502 MG/ML SOAJ    4. Statin intolerance  Z78.9 Evolocumab (REPATHA SURECLICK) 774 MG/ML SOAJ    5. Primary hypertension  I10        RECOMMENDATIONS: Amy [name] is a 66 y.o.  female whose past medical history and cardiac risk factors include: Hypertension, pure hypercholesterolemia, statin intolerance.  Precordial pain Denies anginal discomfort. Results of the echo and GXT reviewed as part of today's office visit. Since she is asymptomatic no additional cardiovascular testing warranted at this time. Total coronary calcium score is 0.  Familial hypercholesterolemia / Pure hypercholesterolemia / Statin intolerance LDL levels March 2023 252 mg/dL, June 2023 184 mg/dL, September 2023 182 mg/dL Continue Zetia. Has been intolerant to atorvastatin, Crestor, pravastatin in the past which caused myalgias. Represcribed Repatha. I am asked her to give me a call back in 2 weeks if not improved. Once Repatha is approved she will need to come in for nurse visit to be educated on administration instructions. When she has been on Repatha for total of 3 doses would recommend repeating fasting lipid profile to reevaluate lipids and CMP.  Primary hypertension Office blood pressures are well controlled. Medications reconciled. Currently managed by primary care provider. No medication changes warranted at this time.  On her coronary calcium score in May 2023 she was noted to have a 3 mm nodular density along the fissure.  Patient is a non-smoker.  Pretest probability of malignancy is low based on size and history.  She has an upcoming office visit with PCP in October and I have asked her to discuss this further to see if additional work-up is warranted.  We will defer additional management to PCP.  Patient verbalized understanding and the importance of  follow-through   FINAL MEDICATION LIST END OF ENCOUNTER: Meds ordered this encounter  Medications   Evolocumab (REPATHA SURECLICK) 128 MG/ML SOAJ    Sig: Inject 140 mg into the skin daily at 2 PM for 6 doses.    Dispense:  2 mL    Refill:  3    There are no discontinued medications.    Current Outpatient Medications:  Cholecalciferol (VITAMIN D3) 50 MCG (2000 UT) capsule, Take 2,000 Units by mouth daily., Disp: , Rfl:    diclofenac (VOLTAREN) 75 MG EC tablet, Take 75 mg by mouth daily., Disp: , Rfl:    Evolocumab (REPATHA SURECLICK) 996 MG/ML SOAJ, Inject 140 mg into the skin daily at 2 PM for 6 doses., Disp: 2 mL, Rfl: 3   ezetimibe (ZETIA) 10 MG tablet, Take 10 mg by mouth at bedtime., Disp: , Rfl:    fexofenadine (ALLEGRA) 180 MG tablet, Take 1 tablet (180 mg total) by mouth daily., Disp: 1 tablet, Rfl: 5   Multiple Vitamin (MULTIVITAMIN) tablet, Take 1 tablet by mouth daily., Disp: , Rfl:    Olopatadine HCl 0.2 % SOLN, Apply 1 drop to eye daily as needed (watery, red, itchy eyes)., Disp: 2.5 mL, Rfl: 5   sertraline (ZOLOFT) 25 MG tablet, SMARTSIG:0.5 Tablet(s) By Mouth Every Evening, Disp: , Rfl:    valsartan (DIOVAN) 40 MG tablet, SMARTSIG:1 Tablet(s) By Mouth Every Evening, Disp: , Rfl:   No orders of the defined types were placed in this encounter.   There are no Patient Instructions on file for this visit.   --Continue cardiac medications as reconciled in final medication list. --Return in about 3 months (around 10/16/2022) for Follow up, Lipid. Or sooner if needed. --Continue follow-up with your primary care physician regarding the management of your other chronic comorbid conditions.  Patient's questions and concerns were addressed to her satisfaction. She voices understanding of the instructions provided during this encounter.   This note was created using a voice recognition software as a result there may be grammatical errors inadvertently enclosed that do not  reflect the nature of this encounter. Every attempt is made to correct such errors.  Rex Kras, [place], [facility]  Pager: [phone] Office: [phone]

## 2022-07-24 ENCOUNTER — Ambulatory Visit: Payer: BC Managed Care – PPO | Admitting: Cardiology

## 2022-07-24 DIAGNOSIS — E7800 Pure hypercholesterolemia, unspecified: Secondary | ICD-10-CM

## 2022-07-24 DIAGNOSIS — Z789 Other specified health status: Secondary | ICD-10-CM

## 2022-07-26 MED ORDER — EVOLOCUMAB 140 MG/ML ~~LOC~~ SOAJ
140.0000 mg | Freq: Once | SUBCUTANEOUS | Status: AC
  Administered 2022-07-24: 140 mg via SUBCUTANEOUS

## 2022-07-26 NOTE — Progress Notes (Signed)
Chief Complaint  Patient presents with   Hyperlipidemia    Repatha 140ML    Administration Action Time Recorded Time Documented By Site Comment Reason Patient Supplied  Given : 140 mg :   : Subcutaneous 07/24/22 0848 07/26/22 0852 Mares, Lesley Left Lower Abdomen UMP:53614-431-54  MGQ:[id number]  EXP:07/22/2023  No

## 2022-09-19 ENCOUNTER — Telehealth: Payer: Self-pay

## 2022-09-19 NOTE — Telephone Encounter (Signed)
Patient called for clarification on how often she is supposed to be doing her repatha injections. She couldn't remember how long in between doses. I let her know it is supposed to be Q2wks. She acknowledged and had no further questions.

## 2022-10-04 ENCOUNTER — Ambulatory Visit: Payer: BC Managed Care – PPO | Admitting: Cardiology

## 2022-11-03 ENCOUNTER — Other Ambulatory Visit: Payer: Self-pay | Admitting: Cardiology

## 2022-11-03 DIAGNOSIS — E7801 Familial hypercholesterolemia: Secondary | ICD-10-CM

## 2022-11-03 DIAGNOSIS — E78019 Familial hypercholesterolemia, unspecified: Secondary | ICD-10-CM

## 2022-11-03 DIAGNOSIS — E7800 Pure hypercholesterolemia, unspecified: Secondary | ICD-10-CM

## 2022-11-03 DIAGNOSIS — Z789 Other specified health status: Secondary | ICD-10-CM

## 2022-12-06 ENCOUNTER — Ambulatory Visit: Payer: BC Managed Care – PPO | Admitting: Cardiology

## 2023-02-25 ENCOUNTER — Other Ambulatory Visit: Payer: Self-pay | Admitting: Cardiology

## 2023-02-25 DIAGNOSIS — E7801 Familial hypercholesterolemia: Secondary | ICD-10-CM

## 2023-02-25 DIAGNOSIS — E7800 Pure hypercholesterolemia, unspecified: Secondary | ICD-10-CM

## 2023-02-25 DIAGNOSIS — Z789 Other specified health status: Secondary | ICD-10-CM

## 2023-03-25 IMAGING — CT CT CARDIAC CORONARY ARTERY CALCIUM SCORE
3 series · 13 of 20 positions shown, 15 images · non-contrast
Comparison: CT abdomen 02/12/2017

CLINICAL DATA: 65-year-old white female with pure
hypercholesterolemia. Precordial pain.

EXAM:
CT CARDIAC CORONARY ARTERY CALCIUM SCORE
TECHNIQUE: Non-contrast imaging through the heart was performed using
prospective ECG gating. Image post processing was performed on an
independent workstation, allowing for quantitative analysis of the
heart and coronary arteries. Note that this exam targets the heart
and the chest was not imaged in its entirety.

[Series 2: calcium scoring 2.00 qr36 bestdiast 70% hrt calciu · axial · 0.37mm/px · z∈[+1536,+1592]mm · 3 of 71 slices shown]
[im 15/71  vessel]
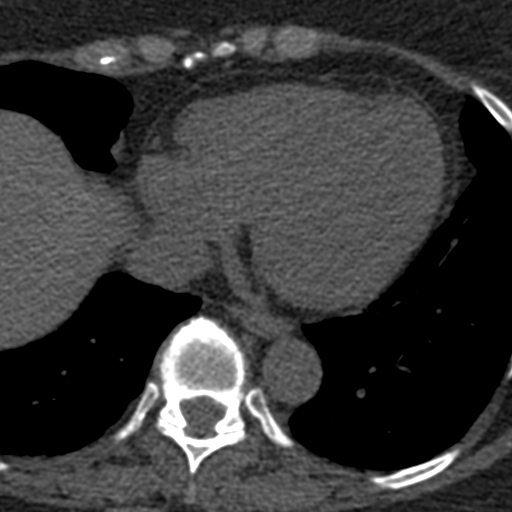
[im 29/71  vessel]
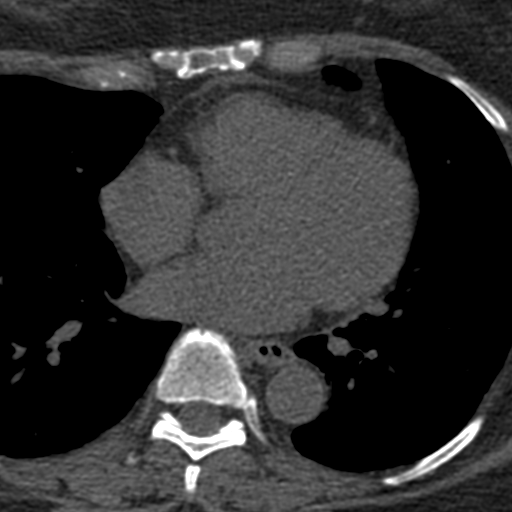
[im 43/71  vessel]
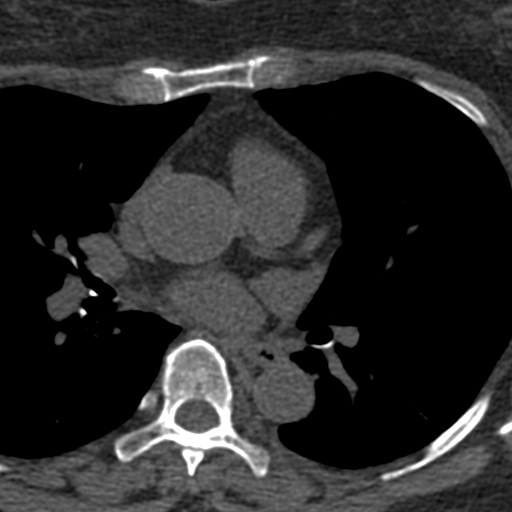

[Series 3: calcium scoring 2.00 br40 bestdiast 70% axial · axial · 0.57mm/px · z∈[+1518,+1622]mm · 5 of 79 slices shown, 7 images]
[im 14/79  vessel]
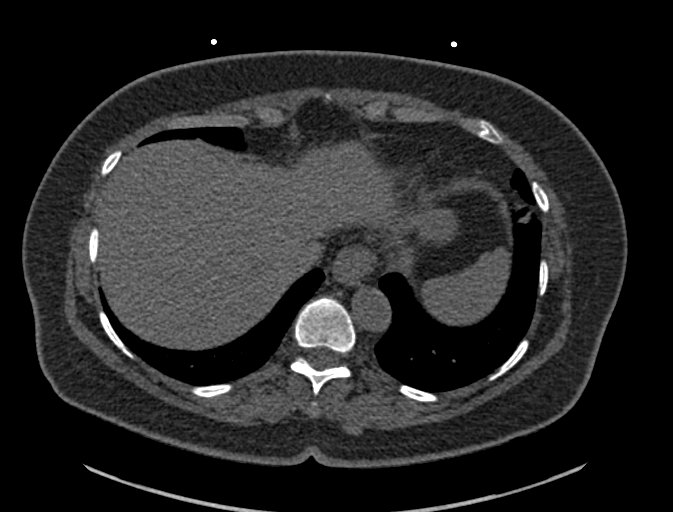
[im 14/79  lung]
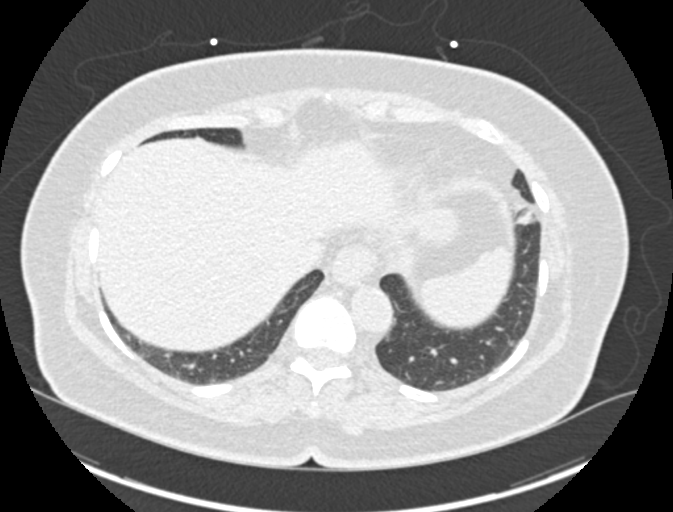
[im 27/79  vessel]
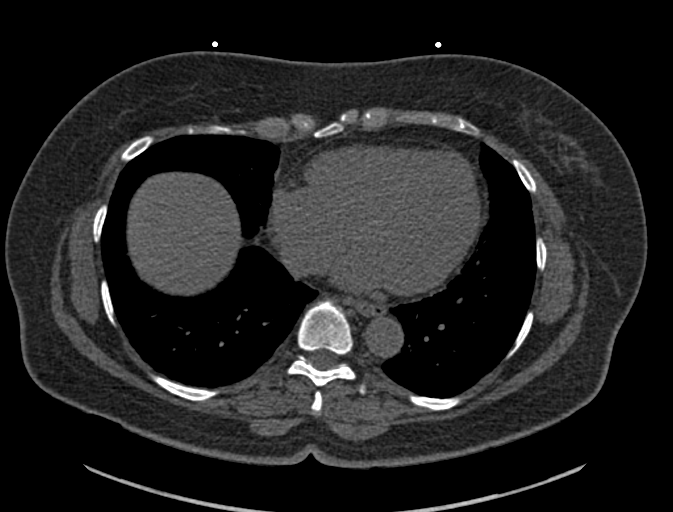
[im 40/79  vessel]
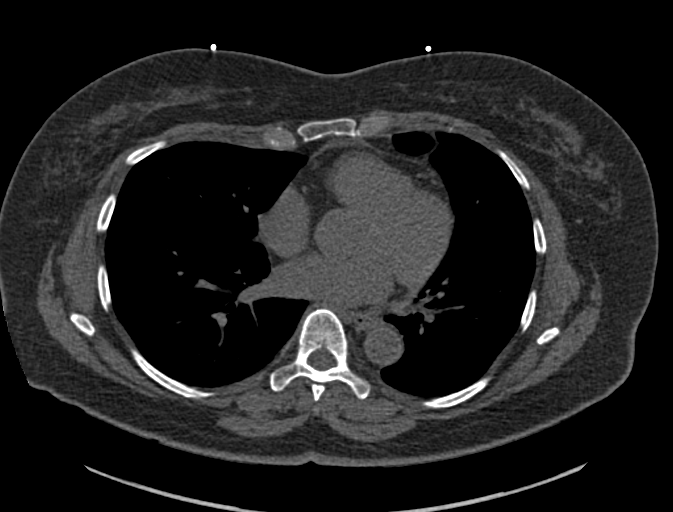
[im 53/79  vessel]
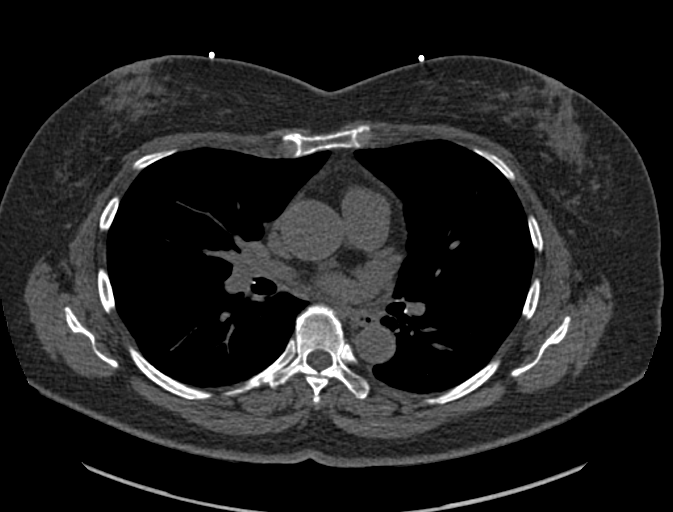
[im 66/79  vessel]
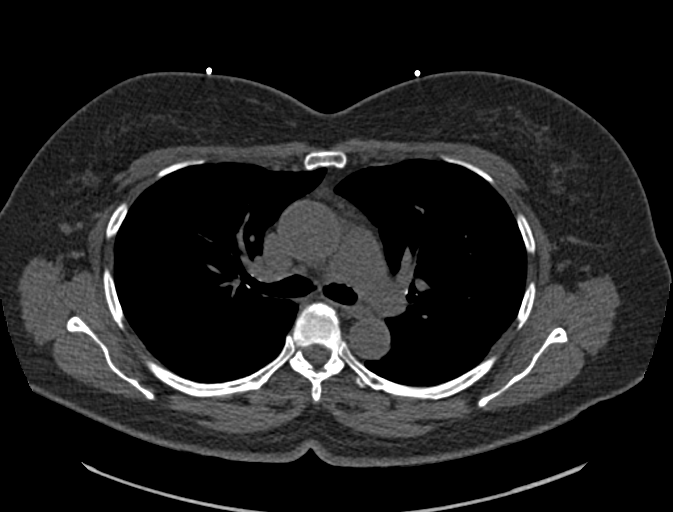
[im 66/79  lung]
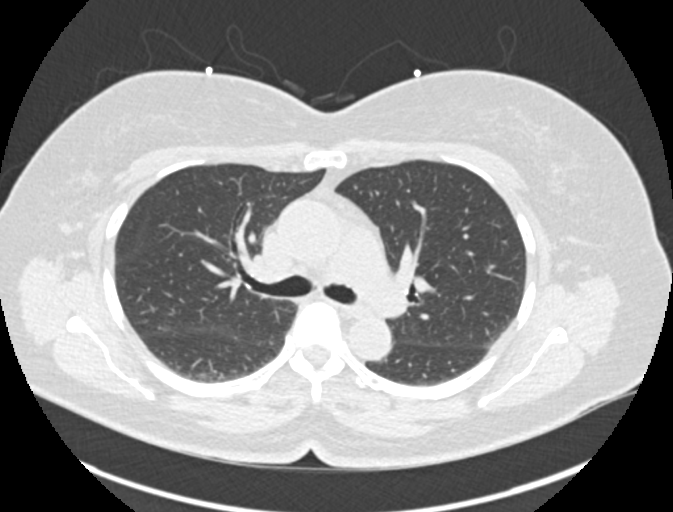

[Series 9: calcium scoring 2.00 br60 bestdiast 70% lungs · axial · 0.57mm/px · z∈[+1518,+1622]mm · 5 of 79 slices shown]
[im 14/79  vessel]
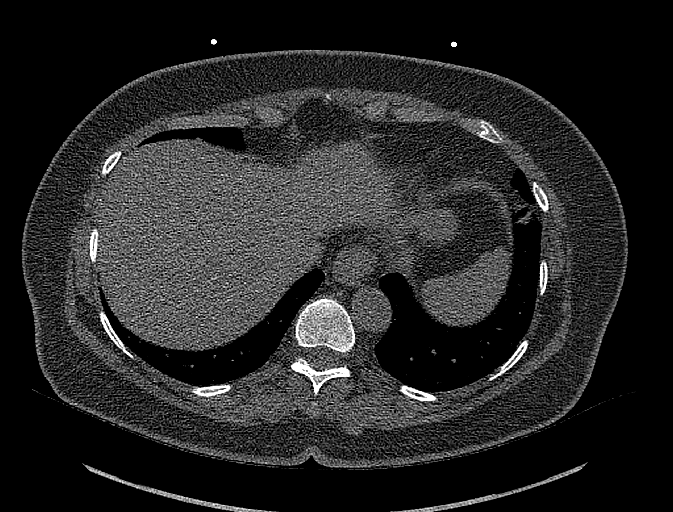
[im 27/79  vessel]
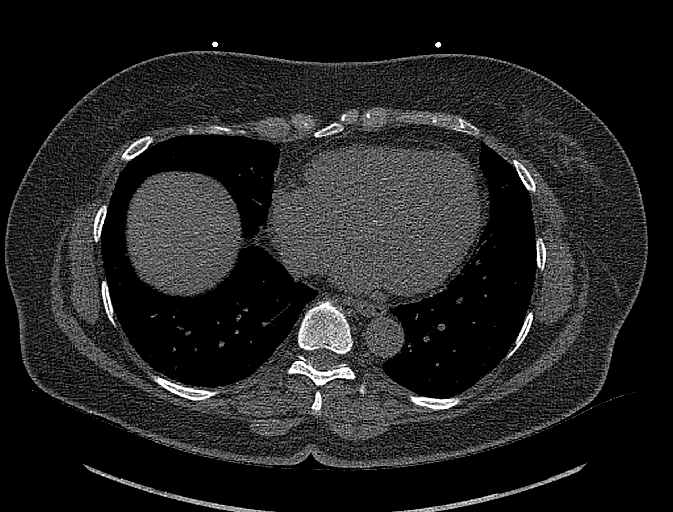
[im 40/79  vessel]
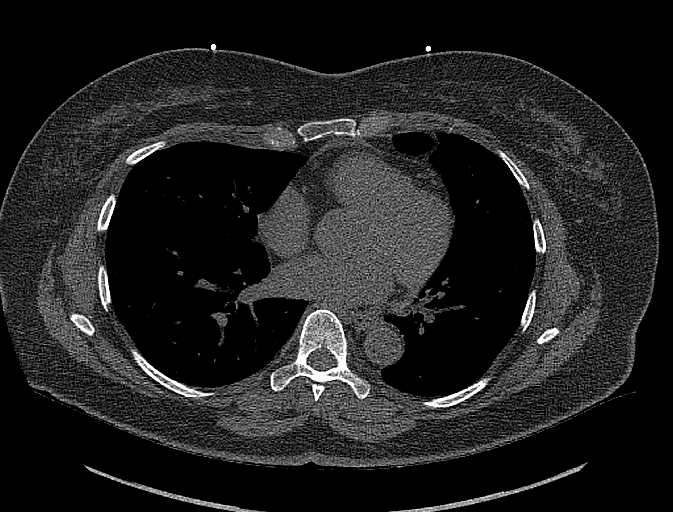
[im 53/79  vessel]
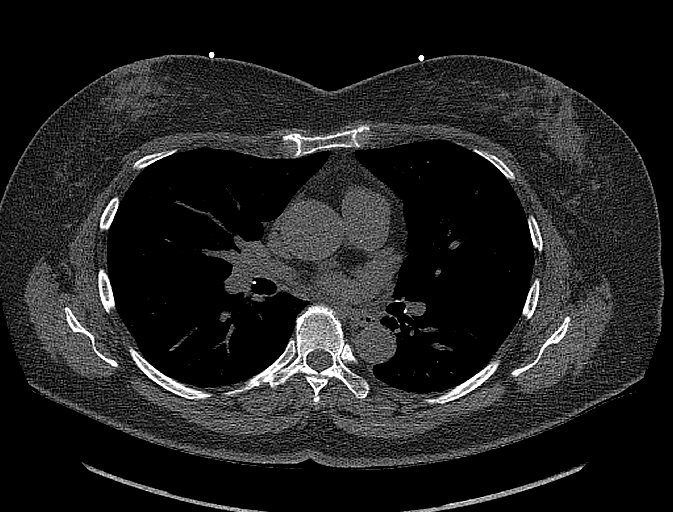
[im 66/79  vessel]
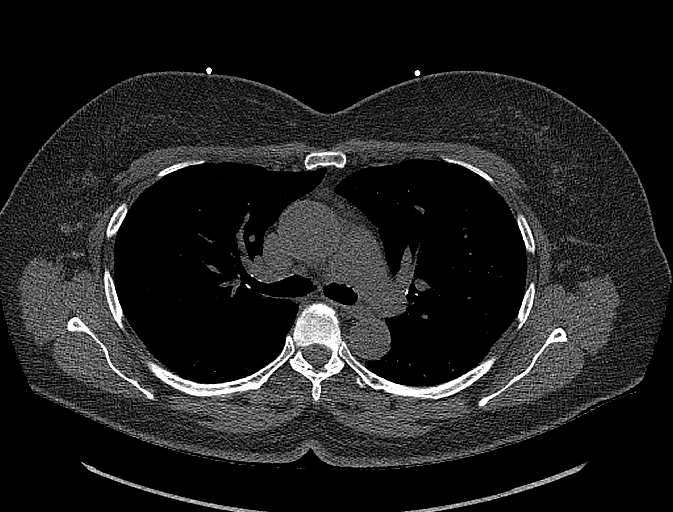

[13 of 20 positions shown; findings below may reference images not displayed]

FINDINGS: CORONARY CALCIUM SCORES:

Left Main: 0

LAD: 0

LCx: 0

RCA: 0

Total Agatston Score: 0

[HOSPITAL] percentile: 0

AORTA MEASUREMENTS:

Ascending Aorta: 33 mm

Descending Aorta: 25 mm

OTHER FINDINGS:

Heart size is normal. Minimal pericardial fluid. Visualized
mediastinal structures are normal. Small amount of atherosclerotic
calcifications in the thoracic aorta. Small hiatal hernia. 3 mm
nodular density in the right upper lobe along the right minor
fissure sequence 9, image 13. Patchy densities along the anterior
left lung base involving the lingula and left lower lobe have
minimally changed since 3769 and most compatible with focal scarring
in this area. No large pleural effusions. No acute bone abnormality.
Again noted is a hypodensity in left hepatic lobe measuring
approximately 1.0 cm and likely a benign incidental finding.
IMPRESSION: 1. Coronary calcium score is 0.
2. Small hiatal hernia.
3. Chronic densities at the anterior left lung base involving the
lingula and left lower lobe. Findings are most compatible with
scarring.
4. 3 mm nodular density along the right minor fissure.
If patient is low risk for malignancy, no routine follow-up imaging
is recommended; if patient is high risk for malignancy, a
non-contrast Chest CT at 12 months is optional. If performed and the
nodule is stable at 12 months, no further follow-up is recommended.
These guidelines do not apply to immunocompromised patients and
patients with cancer. Follow up in patients with significant
comorbidities as clinically warranted. For lung cancer screening,
adhere to Lung-RADS guidelines. Reference: Radiology. 4967;

## 2023-05-23 ENCOUNTER — Other Ambulatory Visit: Payer: Self-pay | Admitting: Family Medicine

## 2023-05-23 DIAGNOSIS — Z1231 Encounter for screening mammogram for malignant neoplasm of breast: Secondary | ICD-10-CM

## 2023-05-30 ENCOUNTER — Ambulatory Visit
Admission: RE | Admit: 2023-05-30 | Discharge: 2023-05-30 | Disposition: A | Discharge status: Home / Self Care | Payer: BC Managed Care – PPO | Source: Ambulatory Visit | Attending: Family Medicine | Admitting: Family Medicine

## 2023-05-30 ENCOUNTER — Inpatient Hospital Stay: Admission: RE | Admit: 2023-05-30 | Payer: BC Managed Care – PPO | Source: Ambulatory Visit

## 2023-05-30 DIAGNOSIS — Z1231 Encounter for screening mammogram for malignant neoplasm of breast: Secondary | ICD-10-CM

## 2023-06-04 ENCOUNTER — Other Ambulatory Visit: Payer: Self-pay | Admitting: Family Medicine

## 2023-06-04 DIAGNOSIS — R928 Other abnormal and inconclusive findings on diagnostic imaging of breast: Secondary | ICD-10-CM

## 2023-06-19 ENCOUNTER — Other Ambulatory Visit: Payer: BC Managed Care – PPO

## 2023-07-02 ENCOUNTER — Other Ambulatory Visit: Payer: BC Managed Care – PPO

## 2023-07-05 ENCOUNTER — Other Ambulatory Visit: Payer: BC Managed Care – PPO

## 2023-07-31 ENCOUNTER — Ambulatory Visit
Admission: RE | Admit: 2023-07-31 | Discharge: 2023-07-31 | Disposition: A | Discharge status: Home / Self Care | Payer: BC Managed Care – PPO | Source: Ambulatory Visit | Attending: Family Medicine | Admitting: Family Medicine

## 2023-07-31 DIAGNOSIS — R928 Other abnormal and inconclusive findings on diagnostic imaging of breast: Secondary | ICD-10-CM

## 2023-08-23 ENCOUNTER — Telehealth: Payer: Self-pay | Admitting: Pharmacy Technician

## 2023-08-23 NOTE — Telephone Encounter (Signed)
Pharmacy Patient Advocate Encounter   Received notification from CoverMyMeds that prior authorization for repatha is required/requested.   Insurance verification completed.   The patient is insured through [employer] .   Per test claim: PA required; PA submitted to above mentioned insurance via CoverMyMeds Key/confirmation #/EOC [id number] Status is pending

## 2023-08-23 NOTE — Telephone Encounter (Signed)
Pharmacy Patient Advocate Encounter  Received notification from [employer] that Prior Authorization for repatha has been APPROVED from 08/23/23 to 08/21/24   PA #/Case ID/Reference #: [id number]

## 2024-05-28 ENCOUNTER — Other Ambulatory Visit: Payer: Self-pay | Admitting: Family Medicine

## 2024-05-28 DIAGNOSIS — Z1231 Encounter for screening mammogram for malignant neoplasm of breast: Secondary | ICD-10-CM

## 2024-07-23 ENCOUNTER — Telehealth: Payer: Self-pay | Admitting: Pharmacy Technician

## 2024-07-23 NOTE — Telephone Encounter (Signed)
   She has not gotten repatha  since 04/2023
